# Patient Record
Sex: Female | Born: 1960 | Race: White | Hispanic: No | Marital: Married
Health system: Midwestern US, Community
[De-identification: ages and names within clinical notes are randomized; demographics above are authoritative.]

## PROBLEM LIST (undated history)

## (undated) DIAGNOSIS — Z1231 Encounter for screening mammogram for malignant neoplasm of breast: Secondary | ICD-10-CM

## (undated) DIAGNOSIS — I10 Essential (primary) hypertension: Secondary | ICD-10-CM

## (undated) DIAGNOSIS — F419 Anxiety disorder, unspecified: Secondary | ICD-10-CM

## (undated) DIAGNOSIS — E669 Obesity, unspecified: Principal | ICD-10-CM

## (undated) DIAGNOSIS — R928 Other abnormal and inconclusive findings on diagnostic imaging of breast: Secondary | ICD-10-CM

## (undated) DIAGNOSIS — M62838 Other muscle spasm: Secondary | ICD-10-CM

## (undated) DIAGNOSIS — J069 Acute upper respiratory infection, unspecified: Secondary | ICD-10-CM

## (undated) DIAGNOSIS — Z131 Encounter for screening for diabetes mellitus: Secondary | ICD-10-CM

## (undated) DIAGNOSIS — M79672 Pain in left foot: Secondary | ICD-10-CM

## (undated) DIAGNOSIS — E782 Mixed hyperlipidemia: Secondary | ICD-10-CM

## (undated) DIAGNOSIS — F32A Depression, unspecified: Secondary | ICD-10-CM

## (undated) DIAGNOSIS — Z6841 Body Mass Index (BMI) 40.0 and over, adult: Secondary | ICD-10-CM

## (undated) DIAGNOSIS — M79671 Pain in right foot: Secondary | ICD-10-CM

## (undated) DIAGNOSIS — Z01419 Encounter for gynecological examination (general) (routine) without abnormal findings: Secondary | ICD-10-CM

## (undated) DIAGNOSIS — E66813 Obesity, class 3 (HCC): Secondary | ICD-10-CM

## (undated) DIAGNOSIS — Z7689 Persons encountering health services in other specified circumstances: Secondary | ICD-10-CM

---

## 2008-12-09 ENCOUNTER — Ambulatory Visit: Payer: Self-pay | Admitting: Diagnostic Radiology

## 2008-12-09 ENCOUNTER — Emergency Department (HOSPITAL_BASED_OUTPATIENT_CLINIC_OR_DEPARTMENT_OTHER): Admission: EM | Admit: 2008-12-09 | Discharge: 2008-12-09 | Payer: Self-pay | Admitting: Emergency Medicine

## 2009-05-19 ENCOUNTER — Inpatient Hospital Stay (HOSPITAL_COMMUNITY): Admission: EM | Admit: 2009-05-19 | Discharge: 2009-05-22 | Payer: Self-pay | Admitting: Emergency Medicine

## 2009-10-15 ENCOUNTER — Ambulatory Visit (HOSPITAL_BASED_OUTPATIENT_CLINIC_OR_DEPARTMENT_OTHER): Admission: RE | Admit: 2009-10-15 | Discharge: 2009-10-15 | Payer: Self-pay | Admitting: Obstetrics and Gynecology

## 2009-10-15 ENCOUNTER — Ambulatory Visit: Payer: Self-pay | Admitting: Diagnostic Radiology

## 2010-05-22 IMAGING — CR DG FINGER THUMB 2+V*R*
3 series · 3 of 3 positions shown · non-contrast
Comparison: None

CLINICAL DATA: Right thumb tip indicated.

RIGHT THUMB 2+V

[x finger pa right]
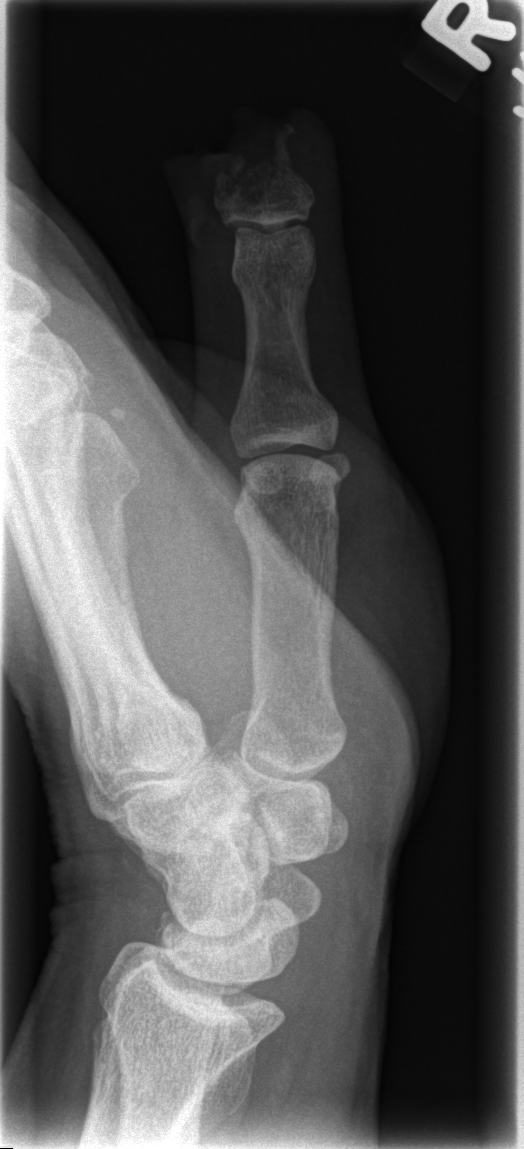

[x finger obl. right]
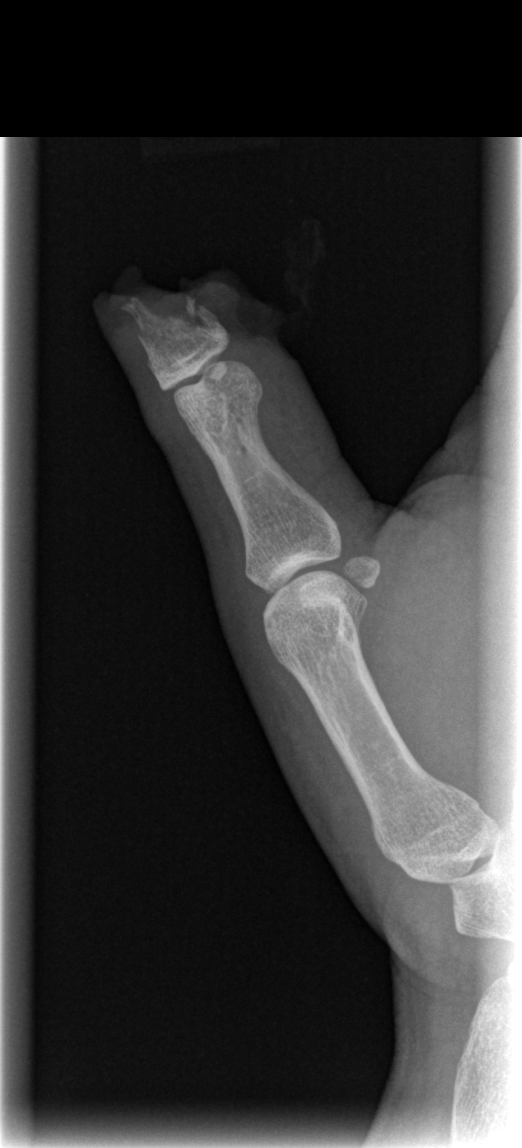

[x finger lateral right]
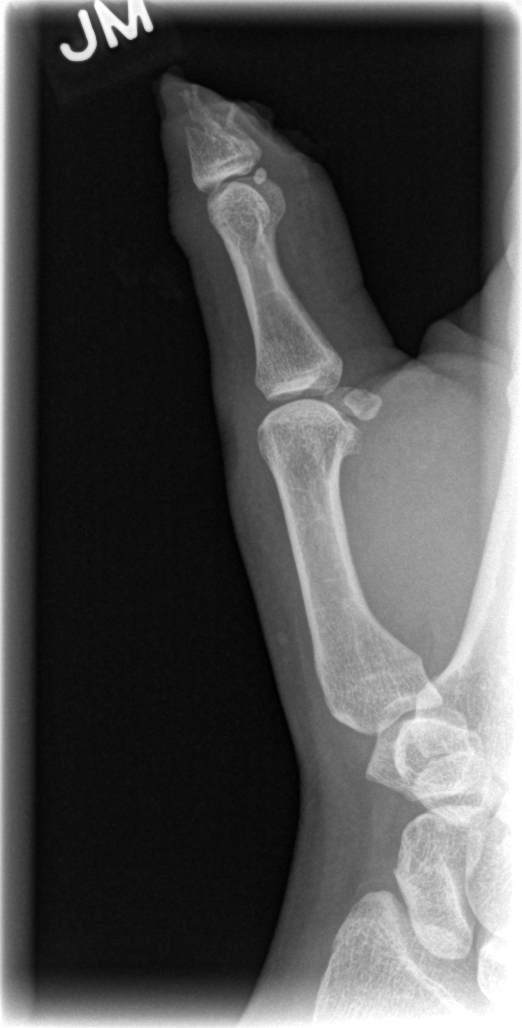

[3 of 3 positions shown; findings below may reference images not displayed]

FINDINGS: Amputation of the right thumb is identified from the mid
distal phalanx distally.
Small fracture fragments are identified along the remaining distal
phalanx.
The interphalangeal joint is unremarkable.
The first metacarpal and proximal phalanx are unremarkable.
IMPRESSION: Amputation of the distal thumb with small fracture fragments along
the remaining distal phalanx.

## 2010-08-01 ENCOUNTER — Other Ambulatory Visit: Payer: Self-pay | Admitting: Obstetrics and Gynecology

## 2010-08-01 DIAGNOSIS — Z780 Asymptomatic menopausal state: Secondary | ICD-10-CM

## 2010-08-01 DIAGNOSIS — Z1231 Encounter for screening mammogram for malignant neoplasm of breast: Secondary | ICD-10-CM

## 2010-10-01 LAB — DIFFERENTIAL
Basophils Absolute: 0 10*3/uL (ref 0.0–0.1)
Basophils Relative: 0 % (ref 0–1)
Eosinophils Absolute: 0.1 10*3/uL (ref 0.0–0.7)
Eosinophils Relative: 1 % (ref 0–5)
Lymphocytes Relative: 18 % (ref 12–46)
Lymphs Abs: 1.5 10*3/uL (ref 0.7–4.0)
Monocytes Absolute: 0.5 10*3/uL (ref 0.1–1.0)
Monocytes Relative: 6 % (ref 3–12)
Neutro Abs: 6.5 10*3/uL (ref 1.7–7.7)
Neutrophils Relative %: 75 % (ref 43–77)

## 2010-10-01 LAB — POCT I-STAT, CHEM 8
Calcium, Ion: 1.19 mmol/L (ref 1.12–1.32)
Chloride: 105 mEq/L (ref 96–112)
Creatinine, Ser: 0.7 mg/dL (ref 0.4–1.2)
Glucose, Bld: 109 mg/dL — ABNORMAL HIGH (ref 70–99)
Hemoglobin: 12.6 g/dL (ref 12.0–15.0)
Potassium: 3.7 mEq/L (ref 3.5–5.1)
Sodium: 139 mEq/L (ref 135–145)

## 2010-10-01 LAB — CBC
HCT: 35.3 % — ABNORMAL LOW (ref 36.0–46.0)
Hemoglobin: 12.3 g/dL (ref 12.0–15.0)
MCHC: 34.7 g/dL (ref 30.0–36.0)
MCV: 87.2 fL (ref 78.0–100.0)
RBC: 4.05 MIL/uL (ref 3.87–5.11)

## 2010-10-01 LAB — PROTIME-INR
INR: 1.02 (ref 0.00–1.49)
Prothrombin Time: 13.3 seconds (ref 11.6–15.2)

## 2010-10-01 LAB — BASIC METABOLIC PANEL
BUN: 7 mg/dL (ref 6–23)
CO2: 27 mEq/L (ref 19–32)
Calcium: 8.5 mg/dL (ref 8.4–10.5)
GFR calc Af Amer: >60 mL/min (ref >60)
Glucose, Bld: 90 mg/dL (ref 70–99)

## 2010-10-01 LAB — GENTAMICIN LEVEL, TROUGH: Gentamicin Trough: 0.6 ug/mL (ref 0.5–2.0)

## 2010-10-01 LAB — VANCOMYCIN, TROUGH: Vancomycin Tr: 19 ug/mL (ref 10.0–20.0)

## 2010-10-17 ENCOUNTER — Ambulatory Visit: Payer: Self-pay

## 2010-10-17 ENCOUNTER — Other Ambulatory Visit: Payer: Self-pay

## 2011-09-11 ENCOUNTER — Other Ambulatory Visit (HOSPITAL_COMMUNITY)
Admission: RE | Admit: 2011-09-11 | Discharge: 2011-09-11 | Disposition: A | Discharge status: Home / Self Care | Payer: 59 | Source: Ambulatory Visit | Attending: Obstetrics and Gynecology | Admitting: Obstetrics and Gynecology

## 2011-09-11 DIAGNOSIS — Z124 Encounter for screening for malignant neoplasm of cervix: Secondary | ICD-10-CM | POA: Insufficient documentation

## 2011-09-11 DIAGNOSIS — Z1159 Encounter for screening for other viral diseases: Secondary | ICD-10-CM | POA: Insufficient documentation

## 2014-01-12 ENCOUNTER — Telehealth: Payer: Self-pay

## 2014-01-12 NOTE — Telephone Encounter (Signed)
Rec'd from [facility] forward 70 pages to Historical Provider

## 2014-01-15 ENCOUNTER — Telehealth: Payer: Self-pay | Admitting: Internal Medicine

## 2014-01-29 NOTE — Telephone Encounter (Signed)
Called patient to let her know that her records were reviewed and accepted.  She said she stayed with [facility], had GI issues and had to be seen right away.  Told patient records would be shredded.

## 2020-04-23 NOTE — Telephone Encounter (Signed)
BENEFITS: [facility]IL L[employer] [facility]    Insurance: ANTHEM  Phone: [phone]  Contact Name: AVAILITY  Effective Date: 10.1.2021     Plan year: YES-CALENDAR  Deductible: 500.00      Deductible Met: 500.00  Allowed/benefits paid at: 100% AFTER DEDUCTIBLE  OOP: 4000.00 MET $1529.00  Freq Limits: 95911 & 95886-BASED ON MEDICAL NECESSITY  Prior Auth Requirement: NO    Notes: NO PRE-EX CLAUSE    Call Reference #: [id number]    Time of call: 8:35AM

## 2020-04-24 ENCOUNTER — Ambulatory Visit
Admit: 2020-04-24 | Discharge: 2020-04-24 | Payer: BLUE CROSS/BLUE SHIELD | Attending: Physical Medicine & Rehabilitation | Primary: Family Medicine

## 2020-04-24 DIAGNOSIS — M79605 Pain in left leg: Secondary | ICD-10-CM

## 2020-08-05 ENCOUNTER — Ambulatory Visit
Admit: 2020-08-05 | Discharge: 2020-08-05 | Payer: BLUE CROSS/BLUE SHIELD | Attending: Physician Assistant | Primary: Family Medicine

## 2020-08-05 DIAGNOSIS — H1013 Acute atopic conjunctivitis, bilateral: Secondary | ICD-10-CM

## 2020-08-05 LAB — CBC WITH AUTO DIFFERENTIAL
Basophils %: 0.6 %
Basophils Absolute: 0 10*3/uL (ref 0.0–0.2)
Eosinophils %: 2.8 %
Eosinophils Absolute: 0.2 10*3/uL (ref 0.0–0.7)
Hematocrit: 39.8 % (ref 37.0–47.0)
Hemoglobin: 13.2 g/dL (ref 12.0–16.0)
Lymphocytes %: 31.7 %
Lymphocytes Absolute: 2 10*3/uL (ref 1.0–4.8)
MCH: 29.1 pg (ref 27.0–31.3)
MCHC: 33.2 % (ref 33.0–37.0)
MCV: 87.7 fL (ref 82.0–100.0)
Monocytes %: 8.9 %
Monocytes Absolute: 0.6 10*3/uL (ref 0.2–0.8)
Neutrophils %: 56 %
Neutrophils Absolute: 3.5 10*3/uL (ref 1.4–6.5)
Platelets: 256 10*3/uL (ref 130–400)
RBC: 4.54 M/uL (ref 4.20–5.40)
RDW: 13.1 % (ref 11.5–14.5)
WBC: 6.2 10*3/uL (ref 4.8–10.8)

## 2020-08-05 LAB — COMPREHENSIVE METABOLIC PANEL
ALT: 14 U/L (ref 0–33)
AST: 14 U/L (ref 0–35)
Albumin: 4.3 g/dL (ref 3.5–4.6)
Alkaline Phosphatase: 112 U/L (ref 40–130)
Anion Gap: 13 mEq/L (ref 9–15)
BUN: 18 mg/dL (ref 8–23)
CO2: 26 mEq/L (ref 20–31)
Calcium: 9.5 mg/dL (ref 8.5–9.9)
Chloride: 105 mEq/L (ref 95–107)
Creatinine: 0.69 mg/dL (ref 0.50–0.90)
GFR African American: >60 (ref >60)
GFR Non-African American: >60 (ref >60)
Globulin: 3.4 g/dL (ref 2.3–3.5)
Glucose: 79 mg/dL (ref 70–99)
Potassium: 4.2 mEq/L (ref 3.4–4.9)
Sodium: 144 mEq/L (ref 135–144)
Total Bilirubin: 0.5 mg/dL (ref 0.2–0.7)
Total Protein: 7.7 g/dL (ref 6.3–8.0)

## 2020-08-05 LAB — LIPID PANEL
Cholesterol, Total: 181 mg/dL (ref 0–199)
HDL: 51 mg/dL (ref 40–59)
LDL Calculated: 91 mg/dL (ref 0–129)
Triglycerides: 197 mg/dL — ABNORMAL HIGH (ref 0–150)

## 2020-08-05 MED ORDER — ESCITALOPRAM OXALATE 5 MG PO TABS
5 MG | ORAL_TABLET | Freq: Every day | ORAL | 4 refills | Status: DC
Start: 2020-08-05 — End: 2021-08-27

## 2020-08-05 MED ORDER — LOSARTAN POTASSIUM 100 MG PO TABS
100 MG | ORAL_TABLET | Freq: Every day | ORAL | 4 refills | Status: DC
Start: 2020-08-05 — End: 2021-08-27

## 2020-08-05 MED ORDER — OLOPATADINE HCL 0.2 % OP SOLN
0.2 | Freq: Every day | OPHTHALMIC | 2 refills | Status: DC
Start: 2020-08-05 — End: 2021-09-30

## 2020-08-05 MED ORDER — ATORVASTATIN CALCIUM 20 MG PO TABS
20 MG | ORAL_TABLET | Freq: Every day | ORAL | 4 refills | Status: DC
Start: 2020-08-05 — End: 2021-08-27

## 2020-08-05 MED ORDER — TIZANIDINE HCL 6 MG PO CAPS
6 MG | ORAL_CAPSULE | Freq: Once | ORAL | 1 refills | Status: AC | PRN
Start: 2020-08-05 — End: 2020-08-05

## 2020-08-05 NOTE — Progress Notes (Signed)
Subjective  Holly Long, 60 y.o. female presents today with:  Chief Complaint   Patient presents with   ??? New Patient     previous PCP Dr. Graciela Husbands   ??? Eye Problem     constant watering in eyes     HPI  Holly Long is in the office today to establish care.  Previous PCP: Dr. Graciela Husbands    Due for screening pap, due for mammogram.    IBD/Crohns.  Controlled with Stelara injections.  Established care with Rachelle Hora.    No recent flare.  Intermittent abdominal spasm/pain.  No melena, hematochezia.     Itchy/watery eyes.  C/o pruritic eyes with irritation/tears.  No purulent drainage.   History of allergies; will take claritin intermittently.   Eye complaint has seemed to worsen since moving to house in [place].   Denies visual disturbance or changes.     HLD.  Controlled with statin.  Denies muscle weakness/myalgias.     Depression and anxiety--controlled with 5 mg lexapro.  Denies worsening mood/depression.  No panic attacks.     HTN--controlled with losartan.  Denies CP, dizziness, SOB, DOE.    Vit D--on replacement therapy.        Review of Systems   Constitutional: Negative for activity change, appetite change, chills, diaphoresis, fatigue and fever.   HENT: Negative for congestion, ear pain, facial swelling, mouth sores, postnasal drip, sinus pressure, tinnitus and trouble swallowing.    Eyes: Positive for discharge and itching. Negative for photophobia and visual disturbance.   Respiratory: Negative for cough, chest tightness, shortness of breath and wheezing.    Cardiovascular: Negative for chest pain, palpitations and leg swelling.   Gastrointestinal: Negative for abdominal distention, abdominal pain, blood in stool, diarrhea, rectal pain and vomiting.   Genitourinary: Negative for dysuria, menstrual problem and pelvic pain.   Skin: Negative for color change and rash.   Neurological: Negative for dizziness, tremors, syncope, speech difficulty, weakness, light-headedness, numbness and headaches.    Psychiatric/Behavioral: Negative for agitation, dysphoric mood and sleep disturbance. The patient is not nervous/anxious.        Past Medical History:   Diagnosis Date   ??? Acute Crohn's disease (HCC)    ??? Hyperlipidemia    ??? Hypertension      Past Surgical History:   Procedure Laterality Date   ??? CARPAL TUNNEL RELEASE     ??? CESAREAN SECTION     ??? TMJ ARTHROPLASTY Left      Social History     Socioeconomic History   ??? Marital status: Unknown     Spouse name: Not on file   ??? Number of children: Not on file   ??? Years of education: Not on file   ??? Highest education level: Not on file   Occupational History   ??? Not on file   Tobacco Use   ??? Smoking status: Former Smoker     Years: 10.00     Types: Cigarettes   ??? Smokeless tobacco: Never Used   Substance and Sexual Activity   ??? Alcohol use: Yes     Alcohol/week: 1.0 - 2.0 standard drink     Types: 1 - 2 Glasses of wine per week   ??? Drug use: Never   ??? Sexual activity: Not on file   Other Topics Concern   ??? Not on file   Social History Narrative   ??? Not on file     Social Determinants of Health     Financial Resource Strain:  Low Risk    ??? Difficulty of Paying Living Expenses: Not hard at all   Food Insecurity: No Food Insecurity   ??? Worried About Programme researcher, broadcasting/film/video in the Last Year: Never true   ??? Ran Out of Food in the Last Year: Never true   Transportation Needs:    ??? Lack of Transportation (Medical): Not on file   ??? Lack of Transportation (Non-Medical): Not on file   Physical Activity:    ??? Days of Exercise per Week: Not on file   ??? Minutes of Exercise per Session: Not on file   Stress:    ??? Feeling of Stress : Not on file   Social Connections:    ??? Frequency of Communication with Friends and Family: Not on file   ??? Frequency of Social Gatherings with Friends and Family: Not on file   ??? Attends Religious Services: Not on file   ??? Active Member of Clubs or Organizations: Not on file   ??? Attends Banker Meetings: Not on file   ??? Marital Status: Not on file    Intimate Partner Violence:    ??? Fear of Current or Ex-Partner: Not on file   ??? Emotionally Abused: Not on file   ??? Physically Abused: Not on file   ??? Sexually Abused: Not on file   Housing Stability:    ??? Unable to Pay for Housing in the Last Year: Not on file   ??? Number of Places Lived in the Last Year: Not on file   ??? Unstable Housing in the Last Year: Not on file     Family History   Problem Relation Age of Onset   ??? High Blood Pressure Mother    ??? High Blood Pressure Father      Allergies   Allergen Reactions   ??? Latex      Current Outpatient Medications   Medication Sig Dispense Refill   ??? COVID-19 mRNA Vacc, Moderna, 100 MCG/0.5ML SUSP injection      ??? escitalopram (LEXAPRO) 5 MG tablet Take 1 tablet by mouth daily 90 tablet 4   ??? tiZANidine (ZANAFLEX) 6 MG capsule Take 1 capsule by mouth once as needed for Muscle spasms Take 6 mg by mouth once as needed for Muscle spasms 90 capsule 1   ??? losartan (COZAAR) 100 MG tablet Take 1 tablet by mouth daily 90 tablet 4   ??? atorvastatin (LIPITOR) 20 MG tablet Take 1 tablet by mouth daily 90 tablet 4   ??? olopatadine (PATADAY) 0.2 % SOLN ophthalmic solution Place 1 drop into both eyes daily 2.5 mL 2   ??? STELARA 90 MG/ML SOSY prefilled syringe      ??? Cholecalciferol (VITAMIN D) 50 MCG (2000 UT) CAPS capsule Take by mouth     ??? loratadine (CLARITIN) 10 MG capsule Take 10 mg by mouth daily     ??? medical marijuana        No current facility-administered medications for this visit.     PMH, Surgical Hx, Family Hx, and Social Hx reviewed and updated.  Health Maintenance reviewed.    Objective  Vitals:    08/05/20 0949   BP: 138/82   Site: Left Lower Arm   Position: Sitting   Cuff Size: Medium Adult   Pulse: 74   Resp: 16   Temp: 97.1 ??F (36.2 ??C)   TempSrc: Temporal   SpO2: 98%   Weight: 271 lb (122.9 kg)   Height: 5\' 7"  (1.702  m)     BP Readings from Last 3 Encounters:   08/05/20 138/82     Wt Readings from Last 3 Encounters:   08/05/20 271 lb (122.9 kg)   04/24/20 254 lb  (115.2 kg)     Physical Exam  Vitals reviewed.   Constitutional:       General: She is not in acute distress.     Appearance: She is not ill-appearing.   HENT:      Head: Normocephalic and atraumatic.      Right Ear: Tympanic membrane, ear canal and external ear normal.      Left Ear: Tympanic membrane, ear canal and external ear normal.      Nose: Nose normal. No congestion.      Mouth/Throat:      Mouth: Mucous membranes are moist.   Eyes:      Conjunctiva/sclera: Conjunctivae normal.   Cardiovascular:      Rate and Rhythm: Normal rate and regular rhythm.   Pulmonary:      Effort: Pulmonary effort is normal.      Breath sounds: Normal breath sounds.   Musculoskeletal:         General: Normal range of motion.   Skin:     General: Skin is warm and dry.   Neurological:      General: No focal deficit present.      Mental Status: She is alert and oriented to person, place, and time.   Psychiatric:         Mood and Affect: Mood normal.         Behavior: Behavior normal.         Thought Content: Thought content normal.         Judgment: Judgment normal.       Assessment & Plan   Hessie was seen today for new patient and eye problem.    Diagnoses and all orders for this visit:    Allergic conjunctivitis of both eyes  -     olopatadine (PATADAY) 0.2 % SOLN ophthalmic solution; Place 1 drop into both eyes daily    Benign essential HTN  -     losartan (COZAAR) 100 MG tablet; Take 1 tablet by mouth daily  -     CBC Auto Differential; Future  -     Comprehensive Metabolic Panel; Future    Need for hepatitis C screening test  -     Hepatitis C Antibody; Future    Screening cholesterol level  -     Lipid Panel    Encounter for screening for HIV  -     HIV Screen; Future    Breast cancer screening by mammogram  -     MAM DIGITAL SCREEN W OR WO CAD BILATERAL; Future    IBD (inflammatory bowel disease)  -     CBC Auto Differential  -     Comprehensive Metabolic Panel    Mixed hyperlipidemia  -     atorvastatin (LIPITOR) 20 MG  tablet; Take 1 tablet by mouth daily    Anxiety and depression  -     escitalopram (LEXAPRO) 5 MG tablet; Take 1 tablet by mouth daily    Lumbar radiculopathy  -     tiZANidine (ZANAFLEX) 6 MG capsule; Take 1 capsule by mouth once as needed for Muscle spasms Take 6 mg by mouth once as needed for Muscle spasms    Other orders  -     Hepatitis C Antibody  -  HIV Screen    4 month follow up with me.  Labs today.  OK for mammogram.  Return in 2-3 weeks for PAP.     Orders Placed This Encounter   Procedures   ??? MAM DIGITAL SCREEN W OR WO CAD BILATERAL     Standing Status:   Future     Standing Expiration Date:   10/03/2021     Order Specific Question:   Reason for exam:     Answer:   screening   ??? Hepatitis C Antibody     Standing Status:   Future     Standing Expiration Date:   08/05/2021   ??? Lipid Panel     Order Specific Question:   Is Patient Fasting?/# of Hours     Answer:   yes, 8 hours   ??? HIV Screen     Standing Status:   Future     Standing Expiration Date:   08/05/2021   ??? CBC Auto Differential     Standing Status:   Future     Standing Expiration Date:   08/05/2021   ??? Comprehensive Metabolic Panel     Standing Status:   Future     Standing Expiration Date:   08/05/2021   ??? Hepatitis C Antibody   ??? HIV Screen   ??? CBC Auto Differential   ??? Comprehensive Metabolic Panel     Orders Placed This Encounter   Medications   ??? escitalopram (LEXAPRO) 5 MG tablet     Sig: Take 1 tablet by mouth daily     Dispense:  90 tablet     Refill:  4   ??? tiZANidine (ZANAFLEX) 6 MG capsule     Sig: Take 1 capsule by mouth once as needed for Muscle spasms Take 6 mg by mouth once as needed for Muscle spasms     Dispense:  90 capsule     Refill:  1   ??? losartan (COZAAR) 100 MG tablet     Sig: Take 1 tablet by mouth daily     Dispense:  90 tablet     Refill:  4   ??? atorvastatin (LIPITOR) 20 MG tablet     Sig: Take 1 tablet by mouth daily     Dispense:  90 tablet     Refill:  4   ??? olopatadine (PATADAY) 0.2 % SOLN ophthalmic solution     Sig:  Place 1 drop into both eyes daily     Dispense:  2.5 mL     Refill:  2     Medications Discontinued During This Encounter   Medication Reason   ??? atorvastatin (LIPITOR) 20 MG tablet DUPLICATE   ??? losartan (COZAAR) 100 MG tablet REORDER   ??? escitalopram (LEXAPRO) 5 MG tablet REORDER   ??? tiZANidine (ZANAFLEX) 6 MG capsule REORDER     No follow-ups on file.      Reviewed with the patient: current clinical status, medications, activities and diet.     Side effects, adverse effects of the medication prescribed today, as well as treatment plan/ rationale and result expectations have been discussed with the patient who expresses understanding and desires to proceed.    Close follow up to evaluate treatment results and for coordination of care.  I have reviewed the patient's medical history in detail and updated the computerized patient record.    Krystle Oberman E Keion Neels, PA-C

## 2020-08-06 LAB — HIV SCREEN: HIV Ag/Ab: NONREACTIVE

## 2020-08-06 LAB — HEPATITIS C ANTIBODY: Hepatitis C Ab: NONREACTIVE

## 2020-08-13 ENCOUNTER — Inpatient Hospital Stay: Admit: 2020-08-13 | Payer: BLUE CROSS/BLUE SHIELD | Primary: Family Medicine

## 2020-08-13 ENCOUNTER — Encounter

## 2020-08-13 DIAGNOSIS — Z1231 Encounter for screening mammogram for malignant neoplasm of breast: Secondary | ICD-10-CM

## 2020-08-15 NOTE — Telephone Encounter (Signed)
From: Holly Long  Sent: 08/15/2020 7:46 AM EST  To: [facility] Clinical Staff  Subject: Holly Long eyedrops    Hi I am still waiting for my eyedrops. [facility] says they could not fill [facility] and reached out to you. I am a frustrated please get these filled for me.  Thank you

## 2020-08-15 NOTE — Telephone Encounter (Signed)
Called pharmacy, the Pataday isn't covered by insurance; however, they gave me alternatives that are covered Zelztin or Epinastin are both covered by insurance.    Please advise and thank you,

## 2020-08-16 MED ORDER — KETOTIFEN FUMARATE 0.025 % OP SOLN
0.025 | Freq: Two times a day (BID) | OPHTHALMIC | 3 refills | Status: AC
Start: 2020-08-16 — End: 2020-08-26

## 2020-08-27 ENCOUNTER — Ambulatory Visit
Admit: 2020-08-27 | Discharge: 2020-08-27 | Payer: BLUE CROSS/BLUE SHIELD | Attending: Physician Assistant | Primary: Family Medicine

## 2020-08-27 ENCOUNTER — Encounter

## 2020-08-27 DIAGNOSIS — Z01419 Encounter for gynecological examination (general) (routine) without abnormal findings: Secondary | ICD-10-CM

## 2020-08-27 MED ORDER — PREMARIN 0.625 MG/GM VA CREA
0.625 MG/GM | VAGINAL | 2 refills | Status: AC
Start: 2020-08-27 — End: 2021-09-30

## 2020-08-27 NOTE — Progress Notes (Signed)
Subjective  Holly Long, 60 y.o. female presents today with:  Chief Complaint   Patient presents with   ??? Gynecologic Exam     HPI  Patient is postmenopausal- No LMP recorded.  Menopausal/perimenopausal symptoms: vaginal dryness.  Hormone therapy side effects: not applicable.    Health Maintenance Due   Topic Date Due   ??? Cervical cancer screen  Never done   ??? Colorectal Cancer Screen  Never done   ??? COVID-19 Vaccine (3 - Booster for Moderna series) 03/26/2020       Hx abnormal PAP: no  Sexual activity: single partner, contraception - none.  Hx of STD: no  Hx of abnormal mammogram: no  Self-breast exams: yes  FH of breast cancer: no  FH of GYN cancer: no   Previous DEXA scan: no  Exercise: no regular exercise    Would also like to discuss referral to endocrinology for adpix for weight loss.   Previously was on medication and did well.  Planning on also making dietary changes.     Review of Systems   Constitutional: Negative for activity change, appetite change, chills, diaphoresis, fatigue and fever.   HENT: Negative for congestion, ear pain, facial swelling, mouth sores, postnasal drip, sinus pressure, tinnitus and trouble swallowing.    Eyes: Negative for photophobia, discharge, itching and visual disturbance.   Respiratory: Negative for cough, chest tightness, shortness of breath and wheezing.    Cardiovascular: Negative for chest pain, palpitations and leg swelling.   Gastrointestinal: Negative for abdominal distention, abdominal pain, blood in stool, diarrhea, rectal pain and vomiting.   Genitourinary: Positive for dyspareunia (due to dryness) and vaginal pain (dryness). Negative for dysuria, menstrual problem, pelvic pain, urgency, vaginal bleeding and vaginal discharge.   Skin: Negative for color change and rash.   Neurological: Negative for dizziness, tremors, syncope, speech difficulty, weakness, light-headedness, numbness and headaches.   Psychiatric/Behavioral: Negative for agitation, dysphoric mood  and sleep disturbance. The patient is not nervous/anxious.      Past Medical History:   Diagnosis Date   ??? Acute Crohn's disease (HCC)    ??? Hyperlipidemia    ??? Hypertension      Past Surgical History:   Procedure Laterality Date   ??? CARPAL TUNNEL RELEASE     ??? CESAREAN SECTION     ??? TMJ ARTHROPLASTY Left      Social History     Socioeconomic History   ??? Marital status: Unknown     Spouse name: Not on file   ??? Number of children: Not on file   ??? Years of education: Not on file   ??? Highest education level: Not on file   Occupational History   ??? Not on file   Tobacco Use   ??? Smoking status: Former Smoker     Years: 10.00     Types: Cigarettes   ??? Smokeless tobacco: Never Used   Substance and Sexual Activity   ??? Alcohol use: Yes     Alcohol/week: 1.0 - 2.0 standard drink     Types: 1 - 2 Glasses of wine per week   ??? Drug use: Never   ??? Sexual activity: Not on file   Other Topics Concern   ??? Not on file   Social History Narrative   ??? Not on file     Social Determinants of Health     Financial Resource Strain: Low Risk    ??? Difficulty of Paying Living Expenses: Not hard at all   Food  Insecurity: No Food Insecurity   ??? Worried About Programme researcher, broadcasting/film/video in the Last Year: Never true   ??? Ran Out of Food in the Last Year: Never true   Transportation Needs:    ??? Lack of Transportation (Medical): Not on file   ??? Lack of Transportation (Non-Medical): Not on file   Physical Activity:    ??? Days of Exercise per Week: Not on file   ??? Minutes of Exercise per Session: Not on file   Stress:    ??? Feeling of Stress : Not on file   Social Connections:    ??? Frequency of Communication with Friends and Family: Not on file   ??? Frequency of Social Gatherings with Friends and Family: Not on file   ??? Attends Religious Services: Not on file   ??? Active Member of Clubs or Organizations: Not on file   ??? Attends Banker Meetings: Not on file   ??? Marital Status: Not on file   Intimate Partner Violence:    ??? Fear of Current or Ex-Partner:  Not on file   ??? Emotionally Abused: Not on file   ??? Physically Abused: Not on file   ??? Sexually Abused: Not on file   Housing Stability:    ??? Unable to Pay for Housing in the Last Year: Not on file   ??? Number of Places Lived in the Last Year: Not on file   ??? Unstable Housing in the Last Year: Not on file     Family History   Problem Relation Age of Onset   ??? High Blood Pressure Mother    ??? High Blood Pressure Father      Allergies   Allergen Reactions   ??? Latex      Current Outpatient Medications   Medication Sig Dispense Refill   ??? tiZANidine (ZANAFLEX) 6 MG capsule TAKE 1 CAPSULE BY MOUTH ONCE AS NEEDED FOR MUSCLE SPASMS     ??? conjugated estrogens (PREMARIN) 0.625 MG/GM vaginal cream Apply daily for 1 week then every other day for 1 week.  Then once weekly. 30 g 2   ??? COVID-19 mRNA Vacc, Moderna, 100 MCG/0.5ML SUSP injection      ??? escitalopram (LEXAPRO) 5 MG tablet Take 1 tablet by mouth daily 90 tablet 4   ??? losartan (COZAAR) 100 MG tablet Take 1 tablet by mouth daily 90 tablet 4   ??? atorvastatin (LIPITOR) 20 MG tablet Take 1 tablet by mouth daily 90 tablet 4   ??? olopatadine (PATADAY) 0.2 % SOLN ophthalmic solution Place 1 drop into both eyes daily 2.5 mL 2   ??? medical marijuana      ??? STELARA 90 MG/ML SOSY prefilled syringe      ??? Cholecalciferol (VITAMIN D) 50 MCG (2000 UT) CAPS capsule Take by mouth     ??? loratadine (CLARITIN) 10 MG capsule Take 10 mg by mouth daily       No current facility-administered medications for this visit.     PMH, Surgical Hx, Family Hx, and Social Hx reviewed and updated.  Health Maintenance reviewed.    Objective  Vitals:    08/27/20 0920   BP: 138/88   Site: Left Upper Arm   Position: Sitting   Cuff Size: Large Adult   Pulse: 79   Resp: 16   Temp: 96.7 ??F (35.9 ??C)   TempSrc: Temporal   SpO2: 99%   Weight: 271 lb (122.9 kg)   Height: 5\' 7"  (1.702 m)  BP Readings from Last 3 Encounters:   08/27/20 138/88   08/05/20 138/82     Wt Readings from Last 3 Encounters:   08/27/20 271  lb (122.9 kg)   08/13/20 260 lb (117.9 kg)   08/05/20 271 lb (122.9 kg)     Physical Exam  Vitals reviewed. Exam conducted with a chaperone present.   Constitutional:       General: She is not in acute distress.     Appearance: She is obese. She is not ill-appearing.   HENT:      Head: Normocephalic and atraumatic.      Right Ear: Tympanic membrane, ear canal and external ear normal.      Left Ear: Tympanic membrane, ear canal and external ear normal.      Nose: Nose normal. No congestion.      Mouth/Throat:      Mouth: Mucous membranes are moist.   Eyes:      Conjunctiva/sclera: Conjunctivae normal.   Cardiovascular:      Rate and Rhythm: Normal rate and regular rhythm.   Pulmonary:      Effort: Pulmonary effort is normal.      Breath sounds: Normal breath sounds.   Genitourinary:     General: Normal vulva.      Labia:         Right: No rash, tenderness, lesion or injury.         Left: No rash, tenderness, lesion or injury.       Comments: Vaginal dryness present.   Musculoskeletal:         General: Normal range of motion.   Skin:     General: Skin is warm and dry.   Neurological:      General: No focal deficit present.      Mental Status: She is alert and oriented to person, place, and time.   Psychiatric:         Mood and Affect: Mood normal.         Behavior: Behavior normal.         Thought Content: Thought content normal.         Judgment: Judgment normal.       Assessment & Plan   Daleah was seen today for gynecologic exam.    Diagnoses and all orders for this visit:    Cervical smear, as part of routine gynecological examination  -     Pap Smear; Future    Vaginal dryness    Menopausal vaginal dryness  -     conjugated estrogens (PREMARIN) 0.625 MG/GM vaginal cream; Apply daily for 1 week then every other day for 1 week.  Then once weekly.    BMI 40.0-44.9, adult [facility])  -     [place] - Kris Hartmann, MD, Endocrinology, [place]    Weight loss counseling, encounter for  -     [place] - Kris Hartmann, MD,  Endocrinology, [facility]    Referral to Dr. Vivia Birmingham.   Trial premarin cream.    6 month follow up with me.    Orders Placed This Encounter   Procedures   ??? Pap Smear     Patient History:    No LMP recorded. Patient is postmenopausal.  OBGYN Status: Postmenopausal  Past Surgical History:  No date: CARPAL TUNNEL RELEASE  No date: CESAREAN SECTION  No date: TMJ ARTHROPLASTY; Left      Social History    Tobacco Use      Smoking status: Former Smoker  Years: 10.00        Types: Cigarettes      Smokeless tobacco: Never Used       Standing Status:   Future     Standing Expiration Date:   08/27/2021     Order Specific Question:   Collection Type     Answer:   Thin Prep     Order Specific Question:   Prior Abnormal Pap Test     Answer:   No     Order Specific Question:   Screening or Diagnostic     Answer:   Screening     Order Specific Question:   HPV Requested?     Answer:   Yes - If Abnormal Reflex HPV     Order Specific Question:   High Risk Patient     Answer:   N/A   ??? [place] - Kris HartmannJeet, Anant, MD, [facility]     Referral Priority:   Routine     Referral Type:   Eval and Treat     Referral Reason:   Specialty Services Required     Referred to Provider:   Kris HartmannAnant Jeet, MD     Requested Specialty:   Endocrinology     Number of Visits Requested:   1     Orders Placed This Encounter   Medications   ??? conjugated estrogens (PREMARIN) 0.625 MG/GM vaginal cream     Sig: Apply daily for 1 week then every other day for 1 week.  Then once weekly.     Dispense:  30 g     Refill:  2     There are no discontinued medications.  No follow-ups on file.      Reviewed with the patient: current clinical status, medications, activities and diet.     Side effects, adverse effects of the medication prescribed today, as well as treatment plan/ rationale and result expectations have been discussed with the patient who expresses understanding and desires to proceed.    Close follow up to evaluate treatment results and for coordination of  care.  I have reviewed the patient's medical history in detail and updated the computerized patient record.    Shamicka Inga E Shanieka Blea, PA-C

## 2020-08-29 NOTE — Telephone Encounter (Signed)
From: Camillia Herter  To: Trilby Leaver Fior  Sent: 08/29/2020 12:38 PM EST  Subject: Medication for dryness    Hi Molly did you send in prescription for the dryness medicine? [facility] yet?

## 2020-09-13 ENCOUNTER — Encounter: Payer: BLUE CROSS/BLUE SHIELD | Attending: "Endocrinology | Primary: Family Medicine

## 2021-02-27 ENCOUNTER — Encounter: Attending: Physician Assistant | Primary: Family Medicine

## 2021-04-11 ENCOUNTER — Ambulatory Visit
Admit: 2021-04-11 | Discharge: 2021-04-11 | Payer: BLUE CROSS/BLUE SHIELD | Attending: Internal Medicine | Primary: Family Medicine

## 2021-04-11 ENCOUNTER — Encounter: Payer: BLUE CROSS/BLUE SHIELD | Attending: Physician Assistant | Primary: Family Medicine

## 2021-04-11 DIAGNOSIS — K529 Noninfective gastroenteritis and colitis, unspecified: Secondary | ICD-10-CM

## 2021-04-11 MED ORDER — TIZANIDINE HCL 4 MG PO TABS
4 MG | ORAL_TABLET | Freq: Every evening | ORAL | 2 refills | Status: DC | PRN
Start: 2021-04-11 — End: 2021-06-18

## 2021-04-11 NOTE — Progress Notes (Signed)
Subjective:      Patient ID: Holly Long is a 60 y.o. female who presents today for:  Chief Complaint   Patient presents with    Establish Care     Wants to discuss her medication tizanidine would like to lower the dose   Requested flu shot   Changed her stelara injection from every 8 weeks to every 4 weeks     Weight Loss       HPI  Patient presenting for a routine follow up visit. Routinely sees [employer], PA-C    HTN- controlled on current treatment- Losartan 100mg  daily. Reports no associated complaints. Adhering to a low salt diet; also trying to make healthy lifestyle changes.     HLD- Controlled on statin therapy. As above, ongoing healthy lifestyle changes.    IBD- Crohn's Disease. Follows with GI at [facility] (Dr. ) at [facility]. Controlled on current treatment. States frequency of dosing was recently decreased from 8 weekly to 4-weekly. Experiences intermittent muscle spasms in this setting which is well controlled on Tizanidine.    Anxiety- Feels her anxiety is well controlled on Lexapro. No associated complaints today.       Past Medical History:   Diagnosis Date    Acute Crohn's disease (HCC)     Hyperlipidemia     Hypertension      Past Surgical History:   Procedure Laterality Date    CARPAL TUNNEL RELEASE      CESAREAN SECTION      TMJ ARTHROPLASTY Left      Family History   Problem Relation Age of Onset    High Blood Pressure Mother     High Blood Pressure Father      Allergies   Allergen Reactions    Latex      Current Outpatient Medications on File Prior to Visit   Medication Sig Dispense Refill    COVID-19 mRNA Vacc, Moderna, 100 MCG/0.5ML SUSP injection       escitalopram (LEXAPRO) 5 MG tablet Take 1 tablet by mouth daily 90 tablet 4    losartan (COZAAR) 100 MG tablet Take 1 tablet by mouth daily 90 tablet 4    atorvastatin (LIPITOR) 20 MG tablet Take 1 tablet by mouth daily 90 tablet 4    olopatadine (PATADAY) 0.2 % SOLN ophthalmic solution Place 1 drop into both eyes daily 2.5 mL 2    STELARA 90  MG/ML SOSY prefilled syringe       Cholecalciferol (VITAMIN D) 50 MCG (2000 UT) CAPS capsule Take by mouth      loratadine (CLARITIN) 10 MG capsule Take 10 mg by mouth daily      conjugated estrogens (PREMARIN) 0.625 MG/GM vaginal cream Apply daily for 1 week then every other day for 1 week.  Then once weekly. (Patient not taking: Reported on 04/11/2021) 30 g 2    medical marijuana        No current facility-administered medications on file prior to visit.         Review of Systems   Constitutional:  Negative for activity change, chills, diaphoresis, fatigue and fever.   Respiratory:  Negative for cough, chest tightness, shortness of breath and wheezing.    Cardiovascular:  Negative for chest pain, palpitations and leg swelling.   Gastrointestinal:  Negative for nausea and vomiting.   Neurological:  Negative for dizziness, syncope, light-headedness and headaches.     Objective:   BP 132/80    Pulse 85    Temp 97.2 ??  F (36.2 ??C) (Temporal)    Wt 271 lb (122.9 kg)    SpO2 97%    BMI 42.44 kg/m??     Physical Exam  Constitutional:       General: She is not in acute distress.     Appearance: Normal appearance. She is normal weight. She is not diaphoretic.   HENT:      Head: Normocephalic and atraumatic.   Cardiovascular:      Rate and Rhythm: Normal rate and regular rhythm.      Pulses: Normal pulses.      Heart sounds: Normal heart sounds. No murmur heard.    No friction rub.   Pulmonary:      Effort: Pulmonary effort is normal. No respiratory distress.      Breath sounds: Normal breath sounds. No wheezing or rhonchi.   Chest:      Chest wall: No tenderness.   Abdominal:      General: Abdomen is flat. Bowel sounds are normal. There is no distension.      Palpations: Abdomen is soft.      Tenderness: There is no abdominal tenderness.   Musculoskeletal:         General: No tenderness. Normal range of motion.      Right lower leg: No edema.      Left lower leg: No edema.   Neurological:      Mental Status: She is alert.        Assessment:      Holly Long was seen today for establish care and weight loss.    Diagnoses and all orders for this visit:    IBD (inflammatory bowel disease)        -     Controlled on Stelara, now on 4-weekly regimen. Follows with Gastroenterology (Dr. Tiburcio Pea, at [facility])        -     Continue treatment per recommendations.    Benign essential HTN        -     Controlled        -     Continue on Losartan 100mg  qd.        -     Low salt diet.    Muscle spasms of both lower extremities  -     tiZANidine (ZANAFLEX) 4 MG tablet; Take 1 tablet by mouth nightly as needed (Muscle spasms)    Encounter for weight loss counseling         -     Counseled on recommended lifestyle modifications- diet, exercise.    Flu vaccine need        -     Influenza, FLUCELVAX, (age 85 mo+), IM, Preservative Free, 0.5 mL      Health Maintenance   Topic Date Due    Colorectal Cancer Screen  Never done    COVID-19 Vaccine (3 - Booster for Moderna series) 03/26/2020    Lipids  08/05/2021    Depression Monitoring  08/05/2021    Breast cancer screen  08/13/2022    Cervical cancer screen  08/28/2023    DTaP/Tdap/Td vaccine (2 - Td or Tdap) 04/06/2029    Flu vaccine  Completed    Shingles vaccine  Completed    Hepatitis C screen  Completed    HIV screen  Completed    Hepatitis A vaccine  Aged Out    Hib vaccine  Aged Out    Meningococcal (ACWY) vaccine  Aged Out    Pneumococcal 0-64 years  Vaccine  Aged Out            Plan:    As above.    Orders Placed This Encounter   Procedures    Influenza, FLUCELVAX, (age 16 mo+), IM, Preservative Free, 0.5 mL    PR INFLUENZA VIRUS VACCINE, NOS     Orders Placed This Encounter   Medications    tiZANidine (ZANAFLEX) 4 MG tablet     Sig: Take 1 tablet by mouth nightly as needed (Muscle spasms)     Dispense:  30 tablet     Refill:  2       Return for F/u in 6 months.        Eben Burow, MD

## 2021-04-11 NOTE — Progress Notes (Signed)
After obtaining consent, and per orders of Dr. Cristy Hilts, injection of influenza given in Right deltoid by Lenis Noon, MA. Patient instructed to remain in clinic for 20 minutes afterwards, and to report any adverse reaction to me immediately.

## 2021-05-06 ENCOUNTER — Encounter

## 2021-06-18 ENCOUNTER — Encounter

## 2021-06-18 MED ORDER — TIZANIDINE HCL 4 MG PO TABS
4 MG | ORAL_TABLET | Freq: Every evening | ORAL | 2 refills | Status: DC | PRN
Start: 2021-06-18 — End: 2021-09-22

## 2021-08-22 ENCOUNTER — Encounter

## 2021-08-27 ENCOUNTER — Ambulatory Visit
Admit: 2021-08-27 | Discharge: 2021-08-27 | Payer: BLUE CROSS/BLUE SHIELD | Attending: Family Medicine | Primary: Family Medicine

## 2021-08-27 ENCOUNTER — Telehealth

## 2021-08-27 ENCOUNTER — Encounter

## 2021-08-27 LAB — HEMOGLOBIN A1C: Hemoglobin A1C: 5.7 % (ref 4.8–5.9)

## 2021-08-27 MED ORDER — ESCITALOPRAM OXALATE 5 MG PO TABS
5 MG | ORAL_TABLET | Freq: Every day | ORAL | 4 refills | Status: AC
Start: 2021-08-27 — End: 2022-03-27

## 2021-08-27 MED ORDER — ATORVASTATIN CALCIUM 20 MG PO TABS
20 MG | ORAL_TABLET | Freq: Every day | ORAL | 4 refills | Status: AC
Start: 2021-08-27 — End: 2022-11-13

## 2021-08-27 MED ORDER — LOSARTAN POTASSIUM 100 MG PO TABS
100 MG | ORAL_TABLET | Freq: Every day | ORAL | 4 refills | Status: AC
Start: 2021-08-27 — End: 2022-11-13

## 2021-08-27 NOTE — Telephone Encounter (Signed)
Patient states that the insurance will cover the [facility],  & [facility], however, patient doesn't want injections.     Please advise and thank you,

## 2021-08-27 NOTE — Patient Instructions (Signed)
Have your lab work done  Refilled your medications  Continue with your gastroenterologist and routine colonoscopies  Find out which medication your insurance will cover for weight loss and let my office know  Follow-up in 6 months

## 2021-08-27 NOTE — Telephone Encounter (Signed)
Patient called back stating that she will need office notes and a pre-authorization completed for mediation.

## 2021-08-27 NOTE — Progress Notes (Signed)
08/27/2021        Holly Long 1960/07/10 is a 61 y.o. female who presents today with:  Chief Complaint   Patient presents with    Establish Care    Weight Loss       HPI:   Holly Long presents today to establish care     HTN- controlled on current treatment- Losartan 100mg  daily. Reports no associated complaints. Adhering to a low salt diet; also trying to make healthy lifestyle changes.      HLD- Controlled on statin therapy. As above, ongoing healthy lifestyle changes.     IBD- Crohn's Disease. Follows with GI on [facility] (Dr. ) at [facility]. Controlled on current treatment.     Muscle spasms b/l Les: Experiences intermittent muscle spasms in this setting which is well controlled on Tizanidine as needed.      Anxiety- Feels her anxiety is well controlled on Lexapro. No associated complaints today.  Denies any paranoia, visual or auditory hallucinations, suicidal or homicidal ideations.    Weight gain -she has felt fatigue recently and has gained 10 pounds since last visit.  Would like to try med as she has tried dieting and exercise. Checking insurance.     Health Maintenance  Colonoscopy: GI follows and does every 2 years  Pap smear: due 08/28/23  Mammogram: due 08/13/22  DEXA: due 10/18/25  PNA VAX: Had 2 years ago  Shingles VAX: done  Tetanus VAX: 04/06/29      Assessment & Plan   1. Encounter to establish care with new doctor  -     Hemoglobin A1C; Future  2. Mixed hyperlipidemia  -     atorvastatin (LIPITOR) 20 MG tablet; Take 1 tablet by mouth daily, Disp-90 tablet, R-4Normal  3. Benign essential HTN  Comments:  -     Controlled  -     Continue on Losartan 100mg  qd.  -     Low salt diet.  Orders:  -     losartan (COZAAR) 100 MG tablet; Take 1 tablet by mouth daily, Disp-90 tablet, R-4Normal  4. Anxiety and depression  -     escitalopram (LEXAPRO) 5 MG tablet; Take 1 tablet by mouth daily, Disp-90 tablet, R-4Normal  5. IBD (inflammatory bowel disease)  Comments:  -     Controlled on St[facility], now on 4-weekly regimen.  Follows with Gastroenterology (Dr. 06/06/29, at [facility])  -     Continue treatment per recommendations.  6. Morbid obesity (HCC)  7. Lumbar radiculopathy  8. Muscle spasms of both lower extremities  Comments:  -     tiZANidine (ZANAFLEX) 4 MG tablet; Take 1 tablet by mouth nightly as needed (Muscle spasms)  9. Other fatigue  -     Vitamin B12; Future  -     TSH with Reflex; Future     Medications Discontinued During This Encounter   Medication Reason    escitalopram (LEXAPRO) 5 MG tablet REORDER    losartan (COZAAR) 100 MG tablet REORDER    atorvastatin (LIPITOR) 20 MG tablet REORDER     No follow-ups on file.        Objective  Allergies   Allergen Reactions    Latex      Current Outpatient Medications   Medication Sig Dispense Refill    atorvastatin (LIPITOR) 20 MG tablet Take 1 tablet by mouth daily 90 tablet 4    losartan (COZAAR) 100 MG tablet Take 1 tablet by mouth daily 90 tablet 4    escitalopram (LEXAPRO)  5 MG tablet Take 1 tablet by mouth daily 90 tablet 4    tiZANidine (ZANAFLEX) 4 MG tablet TAKE 1 TABLET BY MOUTH NIGHTLY AS NEEDED (MUSCLE SPASMS) 30 tablet 2    COVID-19 mRNA Vacc, Moderna, 100 MCG/0.5ML SUSP injection       olopatadine (PATADAY) 0.2 % SOLN ophthalmic solution Place 1 drop into both eyes daily 2.5 mL 2    medical marijuana       STELARA 90 MG/ML SOSY prefilled syringe       Cholecalciferol (VITAMIN D) 50 MCG (2000 UT) CAPS capsule Take by mouth      loratadine (CLARITIN) 10 MG capsule Take 10 mg by mouth daily      conjugated estrogens (PREMARIN) 0.625 MG/GM vaginal cream Apply daily for 1 week then every other day for 1 week.  Then once weekly. (Patient not taking: No sig reported) 30 g 2     No current facility-administered medications for this visit.       PMH, Surgical Hx, Family Hx, and Social Hx reviewed and updated.  Health Maintenance reviewed.    Vitals:    08/27/21 1056   BP: 134/84   Site: Right Upper Arm   Position: Sitting   Cuff Size: Large Adult   Pulse: 78   Temp: 97.8 ??F (36.6  ??C)   TempSrc: Temporal   SpO2: 98%   Weight: 281 lb (127.5 kg)   Height: 5\' 7"  (1.702 m)     BP Readings from Last 3 Encounters:   08/27/21 134/84   04/11/21 132/80   08/27/20 138/88     Wt Readings from Last 3 Encounters:   08/27/21 281 lb (127.5 kg)   04/11/21 271 lb (122.9 kg)   08/27/20 271 lb (122.9 kg)       No results found for: LABA1C  Lab Results   Component Value Date    CREATININE 0.69 08/05/2020     Lab Results   Component Value Date    ALT 14 08/05/2020    AST 14 08/05/2020     Lab Results   Component Value Date    CHOL 181 08/05/2020    TRIG 197 (H) 08/05/2020    HDL 51 08/05/2020    LDLCALC 91 08/05/2020        Review of Systems 14 point Review-of-systems negative unless otherwise stated in HPI.   Physical Exam  Constitutional:       General: She is not in acute distress.     Appearance: Normal appearance. She is not toxic-appearing.   HENT:      Right Ear: External ear normal. There is impacted cerumen.      Left Ear: External ear normal. There is impacted cerumen.      Mouth/Throat:      Mouth: Mucous membranes are moist.   Eyes:      Extraocular Movements: Extraocular movements intact.      Pupils: Pupils are equal, round, and reactive to light.   Neck:      Vascular: No carotid bruit.   Cardiovascular:      Rate and Rhythm: Normal rate and regular rhythm.      Pulses: Normal pulses.      Heart sounds: Normal heart sounds.   Pulmonary:      Effort: No respiratory distress.      Breath sounds: No wheezing.   Abdominal:      General: Bowel sounds are normal.      Palpations: Abdomen is soft.  Tenderness: There is no abdominal tenderness.   Musculoskeletal:         General: Normal range of motion.   Skin:     General: Skin is warm and dry.   Neurological:      Mental Status: She is alert and oriented to person, place, and time. Mental status is at baseline.   Psychiatric:         Mood and Affect: Mood normal.         Behavior: Behavior normal.             Reviewed with the patient: current  clinical status, medications, activities and diet.     Side effects, adverse effects of the medication prescribed today, as well as treatment plan/ rationale and result expectations have been discussed with the patient who expresses understanding and desires to proceed.    Close follow up to evaluate treatment results and for coordination of care.  I have reviewed the patient's medical history in detail and updated the computerized patient record.    Thermon Leyland, MD

## 2021-08-28 LAB — VITAMIN B12: Vitamin B-12: 432 pg/mL (ref 232–1245)

## 2021-08-28 LAB — TSH WITH REFLEX: TSH: 1.16 u[IU]/mL (ref 0.440–3.860)

## 2021-08-28 NOTE — Telephone Encounter (Signed)
Patient called office and I message PCP. Duplicate request

## 2021-08-28 NOTE — Telephone Encounter (Signed)
Patient agrees to the adipex (phentermine) medication instead of the injections.    Please advise and thank you,

## 2021-08-29 ENCOUNTER — Encounter

## 2021-08-29 MED ORDER — PHENTERMINE HCL 37.5 MG PO CAPS
37.5 MG | ORAL_CAPSULE | Freq: Every morning | ORAL | 0 refills | Status: AC
Start: 2021-08-29 — End: 2021-09-28

## 2021-08-29 MED ORDER — PHENTERMINE HCL 37.5 MG PO TABS
37.5 MG | ORAL_TABLET | Freq: Every day | ORAL | 0 refills | Status: DC
Start: 2021-08-29 — End: 2021-08-29

## 2021-08-29 NOTE — Addendum Note (Signed)
Addended by: Ria Comment on: 08/29/2021 11:41 AM     Modules accepted: Orders

## 2021-08-30 NOTE — Telephone Encounter (Signed)
Scheduled 09/30/21

## 2021-09-19 ENCOUNTER — Encounter

## 2021-09-19 NOTE — Telephone Encounter (Signed)
Pharmacy requesting medication refill. Please approve or deny this request.    Rx requested:  Requested Prescriptions     Pending Prescriptions Disp Refills    tiZANidine (ZANAFLEX) 4 MG tablet 30 tablet 2     Sig: Take 1 tablet by mouth nightly as needed (Muscle spasms)         Last Office Visit:   08/27/2021      Next Visit Date:  Future Appointments   Date Time Provider Department Center   09/30/2021 10:15 AM Thermon Leyland, MD [facility]  [place]   02/27/2022 10:00 AM Thermon Leyland, MD [facility]

## 2021-09-22 MED ORDER — TIZANIDINE HCL 4 MG PO TABS
4 MG | ORAL_TABLET | Freq: Every evening | ORAL | 2 refills | Status: AC | PRN
Start: 2021-09-22 — End: ?

## 2021-09-30 ENCOUNTER — Ambulatory Visit
Admit: 2021-09-30 | Discharge: 2021-09-30 | Payer: BLUE CROSS/BLUE SHIELD | Attending: Family Medicine | Primary: Family Medicine

## 2021-09-30 MED ORDER — PHENTERMINE HCL 37.5 MG PO TABS
37.5 MG | ORAL_TABLET | Freq: Every day | ORAL | 0 refills | Status: DC
Start: 2021-09-30 — End: 2021-10-28

## 2021-09-30 NOTE — Patient Instructions (Signed)
Refilled phentermine 37.5 mg daily for 30 days.  Follow-up for refills.

## 2021-09-30 NOTE — Progress Notes (Signed)
09/30/2021        Holly Long August 01, 1960 is a 61 y.o. female who presents today with:  Chief Complaint   Patient presents with    Medication Refill     Adipex   Goal weight 200lbs       HPI:   Holly Long presents today for weight loss follow-up.  She has been taking Adipex since March 2023.  Her initial weight was 281 pounds.  She is at 265 pounds today.  She denies any side effects such as nausea, vomiting, constipation, diarrhea, blurry vision, dry mouth, insomnia, palpitations.  She is doing well and would like to continue.    Assessment & Plan   1. Morbid obesity (HCC)  -     phentermine (ADIPEX-P) 37.5 MG tablet; Take 1 tablet by mouth every morning (before breakfast) for 30 days. Max Daily Amount: 37.5 mg, Disp-30 tablet, R-0Normal     Medications Discontinued During This Encounter   Medication Reason    conjugated estrogens (PREMARIN) 0.625 MG/GM vaginal cream LIST CLEANUP    medical marijuana LIST CLEANUP    olopatadine (PATADAY) 0.2 % SOLN ophthalmic solution LIST CLEANUP     Return in about 4 weeks (around 10/28/2021).        Objective  Allergies   Allergen Reactions    Latex      Current Outpatient Medications   Medication Sig Dispense Refill    phentermine (ADIPEX-P) 37.5 MG tablet Take 1 tablet by mouth every morning (before breakfast) for 30 days. Max Daily Amount: 37.5 mg 30 tablet 0    tiZANidine (ZANAFLEX) 4 MG tablet Take 1 tablet by mouth nightly as needed (Muscle spasms) 30 tablet 2    atorvastatin (LIPITOR) 20 MG tablet Take 1 tablet by mouth daily 90 tablet 4    losartan (COZAAR) 100 MG tablet Take 1 tablet by mouth daily 90 tablet 4    escitalopram (LEXAPRO) 5 MG tablet Take 1 tablet by mouth daily 90 tablet 4    STELARA 90 MG/ML SOSY prefilled syringe       Cholecalciferol (VITAMIN D) 50 MCG (2000 UT) CAPS capsule Take by mouth      loratadine (CLARITIN) 10 MG capsule Take 1 capsule by mouth daily      COVID-19 mRNA Vacc, Moderna, 100 MCG/0.5ML SUSP injection        No current  facility-administered medications for this visit.       PMH, Surgical Hx, Family Hx, and Social Hx reviewed and updated.  Health Maintenance reviewed.    Vitals:    09/30/21 0951   BP: 130/72   Pulse: 90   Temp: 96.8 F (36 C)   TempSrc: Temporal   SpO2: 98%   Weight: 265 lb (120.2 kg)   Height: 5\' 7"  (1.702 m)     BP Readings from Last 3 Encounters:   09/30/21 130/72   08/27/21 134/84   04/11/21 132/80     Wt Readings from Last 3 Encounters:   09/30/21 265 lb (120.2 kg)   08/27/21 281 lb (127.5 kg)   04/11/21 271 lb (122.9 kg)       Lab Results   Component Value Date    LABA1C 5.7 08/27/2021     Lab Results   Component Value Date    CREATININE 0.69 08/05/2020     Lab Results   Component Value Date    ALT 14 08/05/2020    AST 14 08/05/2020     Lab Results   Component Value  Date    CHOL 181 08/05/2020    TRIG 197 (H) 08/05/2020    HDL 51 08/05/2020    LDLCALC 91 08/05/2020        Review of Systems 14 point Review-of-systems negative unless otherwise stated in HPI.   Physical Exam  Constitutional:       General: She is not in acute distress.     Appearance: Normal appearance. She is obese. She is not toxic-appearing.   HENT:      Right Ear: External ear normal. There is impacted cerumen.      Left Ear: External ear normal. There is impacted cerumen.      Mouth/Throat:      Mouth: Mucous membranes are moist.   Eyes:      Extraocular Movements: Extraocular movements intact.      Pupils: Pupils are equal, round, and reactive to light.   Neck:      Vascular: No carotid bruit.   Cardiovascular:      Rate and Rhythm: Normal rate and regular rhythm.      Pulses: Normal pulses.      Heart sounds: Normal heart sounds.   Pulmonary:      Effort: No respiratory distress.      Breath sounds: No wheezing.   Abdominal:      General: Bowel sounds are normal.      Palpations: Abdomen is soft.      Tenderness: There is no abdominal tenderness.   Musculoskeletal:         General: Normal range of motion.   Skin:     General: Skin is  warm and dry.   Neurological:      Mental Status: She is alert and oriented to person, place, and time. Mental status is at baseline.   Psychiatric:         Mood and Affect: Mood normal.         Behavior: Behavior normal.             Reviewed with the patient: current clinical status, medications, activities and diet.     Side effects, adverse effects of the medication prescribed today, as well as treatment plan/ rationale and result expectations have been discussed with the patient who expresses understanding and desires to proceed.    Close follow up to evaluate treatment results and for coordination of care.  I have reviewed the patient's medical history in detail and updated the computerized patient record.    Holly Leyland, MD

## 2021-10-07 NOTE — Telephone Encounter (Signed)
Colonoscopy done at [facility] on 10/2020

## 2021-10-10 ENCOUNTER — Encounter: Payer: BLUE CROSS/BLUE SHIELD | Attending: Internal Medicine | Primary: Family Medicine

## 2021-10-28 ENCOUNTER — Ambulatory Visit
Admit: 2021-10-28 | Discharge: 2021-10-28 | Payer: BLUE CROSS/BLUE SHIELD | Attending: Family Medicine | Primary: Family Medicine

## 2021-10-28 MED ORDER — PHENTERMINE HCL 37.5 MG PO TABS
37.5 MG | ORAL_TABLET | Freq: Every day | ORAL | 0 refills | Status: AC
Start: 2021-10-28 — End: 2021-11-27

## 2021-10-28 NOTE — Patient Instructions (Signed)
Phentermine 37.5 mg daily every morning.  Follow-up in 1 month

## 2021-10-28 NOTE — Progress Notes (Signed)
10/28/2021  Holly Long [birth year] is a 61 y.o. female who presents today with:  Chief Complaint   Patient presents with    Medication Refill     Adipex   Goal weight 200lbs       HPI:   Holly Long presents today for weight loss follow-up.  She has been taking Adipex since March 2023.  Her initial weight was 281 pounds.  She is at 257 pounds today.  She denies any side effects such as nausea, vomiting, constipation, diarrhea, blurry vision, dry mouth, insomnia, palpitations.  She is doing well and would like to continue.  She is aware that is a 30-month course however she will continue it for longer.    Assessment & Plan   1. Morbid obesity (HCC)  -     phentermine (ADIPEX-P) 37.5 MG tablet; Take 1 tablet by mouth every morning (before breakfast) for 30 days. Max Daily Amount: 37.5 mg, Disp-30 tablet, R-0Normal     Medications Discontinued During This Encounter   Medication Reason    conjugated estrogens (PREMARIN) 0.625 MG/GM vaginal cream LIST CLEANUP    medical marijuana LIST CLEANUP    olopatadine (PATADAY) 0.2 % SOLN ophthalmic solution LIST CLEANUP     Return in about 4 weeks (around 10/28/2021).        Objective  Allergies   Allergen Reactions    Latex      Current Outpatient Medications   Medication Sig Dispense Refill    phentermine (ADIPEX-P) 37.5 MG tablet Take 1 tablet by mouth every morning (before breakfast) for 30 days. Max Daily Amount: 37.5 mg 30 tablet 0    tiZANidine (ZANAFLEX) 4 MG tablet Take 1 tablet by mouth nightly as needed (Muscle spasms) 30 tablet 2    atorvastatin (LIPITOR) 20 MG tablet Take 1 tablet by mouth daily 90 tablet 4    losartan (COZAAR) 100 MG tablet Take 1 tablet by mouth daily 90 tablet 4    escitalopram (LEXAPRO) 5 MG tablet Take 1 tablet by mouth daily 90 tablet 4    STELARA 90 MG/ML SOSY prefilled syringe       Cholecalciferol (VITAMIN D) 50 MCG (2000 UT) CAPS capsule Take by mouth      loratadine (CLARITIN) 10 MG capsule Take 1 capsule by mouth daily      COVID-19 mRNA Vacc,  Moderna, 100 MCG/0.5ML SUSP injection        No current facility-administered medications for this visit.       PMH, Surgical Hx, Family Hx, and Social Hx reviewed and updated.  Health Maintenance reviewed.    Vitals:    09/30/21 0951   BP: 130/72   Pulse: 90   Temp: 96.8 F (36 C)   TempSrc: Temporal   SpO2: 98%   Weight: 265 lb (120.2 kg)   Height: 5\' 7"  (1.702 m)     BP Readings from Last 3 Encounters:   09/30/21 130/72   08/27/21 134/84   04/11/21 132/80     Wt Readings from Last 3 Encounters:   09/30/21 265 lb (120.2 kg)   08/27/21 281 lb (127.5 kg)   04/11/21 271 lb (122.9 kg)       Lab Results   Component Value Date    LABA1C 5.7 08/27/2021     Lab Results   Component Value Date    CREATININE 0.69 08/05/2020     Lab Results   Component Value Date    ALT 14 08/05/2020    AST 14 08/05/2020  Lab Results   Component Value Date    CHOL 181 08/05/2020    TRIG 197 (H) 08/05/2020    HDL 51 08/05/2020    LDLCALC 91 08/05/2020        Review of Systems 14 point Review-of-systems negative unless otherwise stated in HPI.   Physical Exam  Constitutional:       General: She is not in acute distress.     Appearance: Normal appearance. She is obese. She is not toxic-appearing.   HENT:      Right Ear: External ear normal.      Left Ear: External ear normal.     Mouth/Throat:      Mouth: Mucous membranes are moist.   Eyes:      Extraocular Movements: Extraocular movements intact.      Pupils: Pupils are equal, round, and reactive to light.   Neck:      Vascular: No carotid bruit.   Cardiovascular:      Rate and Rhythm: Normal rate and regular rhythm.      Pulses: Normal pulses.      Heart sounds: Normal heart sounds.   Pulmonary:      Effort: No respiratory distress.      Breath sounds: No wheezing.   Abdominal:      General: Bowel sounds are normal.      Palpations: Abdomen is soft.      Tenderness: There is no abdominal tenderness.   Musculoskeletal:         General: Normal range of motion.   Skin:     General: Skin is  warm and dry.   Neurological:      Mental Status: She is alert and oriented to person, place, and time. Mental status is at baseline.   Psychiatric:         Mood and Affect: Mood normal.         Behavior: Behavior normal.             Reviewed with the patient: current clinical status, medications, activities and diet.     Side effects, adverse effects of the medication prescribed today, as well as treatment plan/ rationale and result expectations have been discussed with the patient who expresses understanding and desires to proceed.    Close follow up to evaluate treatment results and for coordination of care.  I have reviewed the patient's medical history in detail and updated the computerized patient record.    Thermon Leyland, MD

## 2021-11-19 NOTE — Telephone Encounter (Signed)
-----   Message from Danella Deis sent at 11/19/2021  2:39 PM EDT -----  Subject: Message to Provider    QUESTIONS  Information for Provider? R/s 5/29 appt to 6/29 at pt's request. She says   she will be out of town and plans to take a break from Adipex for 1 month   during that time.  ---------------------------------------------------------------------------  --------------  Holly Long INFO  [phone]; OK to leave message on voicemail,OK to respond with electronic   message via MyChart portal (only for patients who have registered MyChart   account)  ---------------------------------------------------------------------------  --------------  SCRIPT ANSWERS  Relationship to Patient? Self

## 2021-11-19 NOTE — Telephone Encounter (Signed)
Please advise and thank you,

## 2021-11-22 NOTE — Telephone Encounter (Signed)
My chart message sent also

## 2021-11-22 NOTE — Telephone Encounter (Signed)
Patient was called to let her know that stopping medication for a month is fine.

## 2021-11-26 ENCOUNTER — Encounter: Payer: BLUE CROSS/BLUE SHIELD | Attending: Family Medicine | Primary: Family Medicine

## 2021-12-11 ENCOUNTER — Ambulatory Visit: Admit: 2021-12-11 | Payer: BLUE CROSS/BLUE SHIELD | Primary: Family Medicine

## 2021-12-11 ENCOUNTER — Ambulatory Visit: Admit: 2021-12-11 | Discharge: 2021-12-11 | Payer: BLUE CROSS/BLUE SHIELD | Attending: Family | Primary: Family Medicine

## 2021-12-11 DIAGNOSIS — M25532 Pain in left wrist: Secondary | ICD-10-CM

## 2021-12-11 MED ORDER — PREDNISONE 50 MG PO TABS
50 MG | ORAL_TABLET | Freq: Every day | ORAL | 0 refills | Status: AC
Start: 2021-12-11 — End: 2021-12-16

## 2021-12-11 NOTE — Progress Notes (Signed)
Subjective:      Patient ID: Holly Long is a 61 y.o. female who presents today for:  Chief Complaint   Patient presents with    Wrist Pain     Patient presents today with left wrist pain, painful with movement        HPI        Started hurting yesterday around 4 after walking her dog   She noticed its swollen   The pinky side from the elbow down in to the hand   Its throbbing   Pulse strong and good temp arm   Very tender to touch   Cannot take NSAIDs   Was taking tylenol with no relief   Denies any numbness or tingling     Past Medical History:   Diagnosis Date    Acute Crohn's disease (HCC)     Hyperlipidemia     Hypertension      Past Surgical History:   Procedure Laterality Date    CARPAL TUNNEL RELEASE      CESAREAN SECTION      TMJ ARTHROPLASTY Left      Social History     Socioeconomic History    Marital status: Unknown     Spouse name: Not on file    Number of children: Not on file    Years of education: Not on file    Highest education level: Not on file   Occupational History    Not on file   Tobacco Use    Smoking status: Former     Packs/day: 0.50     Years: 10.00     Pack years: 5.00     Types: Cigarettes     Quit date: 06/29/1981     Years since quitting: 40.4     Passive exposure: Never    Smokeless tobacco: Never   Vaping Use    Vaping Use: Never used   Substance and Sexual Activity    Alcohol use: Yes     Alcohol/week: 1.0 - 2.0 standard drink     Types: 1 - 2 Glasses of wine per week    Drug use: Never    Sexual activity: Not on file   Other Topics Concern    Not on file   Social History Narrative    Not on file     Social Determinants of Health     Financial Resource Strain: Low Risk     Difficulty of Paying Living Expenses: Not very hard   Food Insecurity: No Food Insecurity    Worried About Running Out of Food in the Last Year: Never true    Ran Out of Food in the Last Year: Never true   Transportation Needs: Unknown    Lack of Transportation (Medical): Not on file    Lack of Transportation  (Non-Medical): No   Physical Activity: Not on file   Stress: Not on file   Social Connections: Not on file   Intimate Partner Violence: Not on file   Housing Stability: Unknown    Unable to Pay for Housing in the Last Year: Not on file    Number of Places Lived in the Last Year: Not on file    Unstable Housing in the Last Year: No     Family History   Problem Relation Age of Onset    High Blood Pressure Mother     High Blood Pressure Father      Allergies   Allergen Reactions  Latex      Current Outpatient Medications   Medication Sig Dispense Refill    predniSONE (DELTASONE) 50 MG tablet Take 1 tablet by mouth daily for 5 days 5 tablet 0    tiZANidine (ZANAFLEX) 4 MG tablet Take 1 tablet by mouth nightly as needed (Muscle spasms) 30 tablet 2    atorvastatin (LIPITOR) 20 MG tablet Take 1 tablet by mouth daily 90 tablet 4    losartan (COZAAR) 100 MG tablet Take 1 tablet by mouth daily 90 tablet 4    escitalopram (LEXAPRO) 5 MG tablet Take 1 tablet by mouth daily 90 tablet 4    COVID-19 mRNA Vacc, Moderna, 100 MCG/0.5ML SUSP injection       STELARA 90 MG/ML SOSY prefilled syringe       Cholecalciferol (VITAMIN D) 50 MCG (2000 UT) CAPS capsule Take by mouth      loratadine (CLARITIN) 10 MG capsule Take 1 capsule by mouth daily       No current facility-administered medications for this visit.          Review of Systems   Constitutional:  Negative for chills, fatigue and fever.   HENT:  Negative for congestion, rhinorrhea and sore throat.    Respiratory:  Negative for cough, shortness of breath and wheezing.    Gastrointestinal:  Negative for diarrhea, nausea and vomiting.   Musculoskeletal:  Positive for arthralgias. Negative for myalgias.   Skin:  Negative for rash.   Neurological:  Negative for headaches.   Psychiatric/Behavioral:  Negative for agitation, confusion and hallucinations.      Objective:   BP 128/80 (Site: Right Upper Arm, Position: Sitting, Cuff Size: Large Adult)   Pulse 84   Temp 97.4 F (36.3 C)  (Temporal)   Ht 5\' 7"  (1.702 m)   Wt 255 lb (115.7 kg)   SpO2 98%   BMI 39.94 kg/m     Physical Exam  Vitals reviewed.   Constitutional:       Appearance: Normal appearance.   HENT:      Head: Normocephalic and atraumatic.      Nose: Nose normal.      Mouth/Throat:      Lips: Pink.   Eyes:      General: Lids are normal.      Conjunctiva/sclera: Conjunctivae normal.   Cardiovascular:      Rate and Rhythm: Normal rate.   Pulmonary:      Effort: Pulmonary effort is normal.   Musculoskeletal:      Left wrist: Swelling and tenderness present. Decreased range of motion.      Left hand: Tenderness present. Decreased range of motion. Normal pulse.        Arms:       Cervical back: Normal range of motion.      Comments: Very tender to touch on the ulnar side hand and wrist, limited ROM due to pain    Skin:     General: Skin is warm and dry.   Neurological:      General: No focal deficit present.      Mental Status: She is alert and oriented to person, place, and time.   Psychiatric:         Mood and Affect: Mood normal.         Behavior: Behavior normal. Behavior is cooperative.       Assessment:       Diagnosis Orders   1. Wrist pain, acute, left  predniSONE (DELTASONE) 50 MG  tablet    XR HAND LEFT (MIN 3 VIEWS)    XR WRIST LEFT (MIN 3 VIEWS)    [employer] Wrist Brace, Left        No results found for this visit on 12/11/21.   Plan:     Assessment & Plan   Holly Long was seen today for wrist pain.    Diagnoses and all orders for this visit:    Wrist pain, acute, left  -     predniSONE (DELTASONE) 50 MG tablet; Take 1 tablet by mouth daily for 5 days  -     XR HAND LEFT (MIN 3 VIEWS); Future  -     XR WRIST LEFT (MIN 3 VIEWS); Future  -     [employer] Wrist Brace, Left      Orders Placed This Encounter   Procedures    XR HAND LEFT (MIN 3 VIEWS)     Standing Status:   Future     Number of Occurrences:   1     Standing Expiration Date:   12/11/2021     Order Specific Question:    Reason for exam:     Answer:   injury, pain ulnar side    XR WRIST LEFT (MIN 3 VIEWS)     Standing Status:   Future     Number of Occurrences:   1     Standing Expiration Date:   12/12/2022     Order Specific Question:   Reason for exam:     Answer:   injury, pain ulnar side    [employer] Wrist Brace, Left     Order Specific Question:   Equipment:     Answer:   Wrist Brace, Left     Orders Placed This Encounter   Medications    predniSONE (DELTASONE) 50 MG tablet     Sig: Take 1 tablet by mouth daily for 5 days     Dispense:  5 tablet     Refill:  0     There are no discontinued medications.  Return if symptoms worsen or fail to improve.        Reviewed with the patient/family: current clinical status & medications.  Side effects of the medication prescribed today, as well as treatment plan/rationale and result expectations have been discussed with the patient/family who expresses understanding. Patient will be discharged home in stable condition.    Follow up with PCP to evaluate treatment results or return if symptoms worsen or fail to improve. Discussed signs and symptoms which require immediate follow-up in ED/call to 911.  Understanding verbalized.     I have reviewed the patient's medical history in detail and updated the computerized patient record.    Sydell Axon, APRN - CNP

## 2021-12-25 ENCOUNTER — Ambulatory Visit
Admit: 2021-12-25 | Discharge: 2021-12-25 | Payer: BLUE CROSS/BLUE SHIELD | Attending: Family Medicine | Primary: Family Medicine

## 2021-12-25 DIAGNOSIS — Z713 Dietary counseling and surveillance: Secondary | ICD-10-CM

## 2021-12-25 MED ORDER — PHENTERMINE HCL 37.5 MG PO CAPS
37.5 MG | ORAL_CAPSULE | Freq: Every morning | ORAL | 0 refills | Status: AC
Start: 2021-12-25 — End: 2022-01-24

## 2021-12-25 NOTE — Patient Instructions (Signed)
Start taking Lexapro 5 mg, two tablets daily  Refilled Phentermine x 30 days

## 2021-12-25 NOTE — Progress Notes (Addendum)
12/25/2021        Holly Long April 12, 1961 is a 61 y.o. female who presents today with:  Chief Complaint   Patient presents with   . Follow-up   . Medication Refill     Patient presents today for a follow and medication refill        HPI:   Holly Long presents today for weight loss follow-up.  She has been taking Adipex since March 2023.  Her initial weight was 281 pounds.  She is at 259 pounds today, up 2 pounds from 10/28/21.  She denies any side effects such as nausea, vomiting, constipation, diarrhea, blurry vision, dry mouth, insomnia, palpitations.  She is doing well and would like to continue.     She would like to increased her lexapro as she feels her anxiety can be better controlled. Patient denies any paranoia, visual or auditory hallucinations, suicidal or homicidal ideations.     Assessment & Plan   1. Encounter for weight loss counseling  -     phentermine 37.5 MG capsule; Take 1 capsule by mouth every morning for 30 days. Max Daily Amount: 37.5 mg, Disp-30 capsule, R-0Normal  2. Morbid obesity (HCC)  -     phentermine 37.5 MG capsule; Take 1 capsule by mouth every morning for 30 days. Max Daily Amount: 37.5 mg, Disp-30 capsule, R-0Normal  3. Anxiety and depression     There are no discontinued medications.  Return in about 1 month (around 01/24/2022).    Patient advised to increase lexapro to 10 mg daily  Refilled phentermine    Objective  Allergies   Allergen Reactions   . Latex      Current Outpatient Medications   Medication Sig Dispense Refill   . phentermine 37.5 MG capsule Take 1 capsule by mouth every morning for 30 days. Max Daily Amount: 37.5 mg 30 capsule 0   . tiZANidine (ZANAFLEX) 4 MG tablet Take 1 tablet by mouth nightly as needed (Muscle spasms) 30 tablet 2   . atorvastatin (LIPITOR) 20 MG tablet Take 1 tablet by mouth daily 90 tablet 4   . losartan (COZAAR) 100 MG tablet Take 1 tablet by mouth daily 90 tablet 4   . escitalopram (LEXAPRO) 5 MG tablet Take 1 tablet by mouth daily 90 tablet 4    . COVID-19 mRNA Vacc, Moderna, 100 MCG/0.5ML SUSP injection      . STELARA 90 MG/ML SOSY prefilled syringe      . Cholecalciferol (VITAMIN D) 50 MCG (2000 UT) CAPS capsule Take by mouth     . loratadine (CLARITIN) 10 MG capsule Take 1 capsule by mouth daily       No current facility-administered medications for this visit.       PMH, Surgical Hx, Family Hx, and Social Hx reviewed and updated.  Health Maintenance reviewed.    Vitals:    12/25/21 1049   BP: 130/78   Site: Left Upper Arm   Position: Sitting   Cuff Size: Large Adult   Pulse: 75   Temp: 97.9 F (36.6 C)   TempSrc: Temporal   SpO2: 96%   Weight: 259 lb (117.5 kg)   Height: 5\' 7"  (1.702 m)     BP Readings from Last 3 Encounters:   12/25/21 130/78   12/11/21 128/80   10/28/21 128/84     Wt Readings from Last 3 Encounters:   12/25/21 259 lb (117.5 kg)   12/11/21 255 lb (115.7 kg)   10/28/21 257 lb (116.6 kg)  Lab Results   Component Value Date    LABA1C 5.7 08/27/2021     Lab Results   Component Value Date    CREATININE 0.69 08/05/2020     Lab Results   Component Value Date    ALT 14 08/05/2020    AST 14 08/05/2020     Lab Results   Component Value Date    CHOL 181 08/05/2020    TRIG 197 (H) 08/05/2020    HDL 51 08/05/2020    LDLCALC 91 08/05/2020        Review of Systems   Physical Exam  Constitutional:       General: She is not in acute distress.     Appearance: Normal appearance. She is not toxic-appearing.   HENT:      Right Ear: External ear normal.      Left Ear: External ear normal.      Mouth/Throat:      Mouth: Mucous membranes are moist.   Eyes:      Extraocular Movements: Extraocular movements intact.      Pupils: Pupils are equal, round, and reactive to light.   Neck:      Vascular: No carotid bruit.   Cardiovascular:      Rate and Rhythm: Normal rate and regular rhythm.      Pulses: Normal pulses.      Heart sounds: Normal heart sounds.   Pulmonary:      Effort: No respiratory distress.      Breath sounds: No wheezing.   Abdominal:       General: Bowel sounds are normal.      Palpations: Abdomen is soft.      Tenderness: There is no abdominal tenderness.   Musculoskeletal:         General: Normal range of motion.   Skin:     General: Skin is warm and dry.   Neurological:      Mental Status: She is alert and oriented to person, place, and time. Mental status is at baseline.   Psychiatric:         Mood and Affect: Mood normal.         Behavior: Behavior normal.             Reviewed with the patient: current clinical status, medications, activities and diet.     Side effects, adverse effects of the medication prescribed today, as well as treatment plan/ rationale and result expectations have been discussed with the patient who expresses understanding and desires to proceed.    Close follow up to evaluate treatment results and for coordination of care.  I have reviewed the patient's medical history in detail and updated the computerized patient record.    Thermon Leyland, MD

## 2022-01-26 ENCOUNTER — Ambulatory Visit
Admit: 2022-01-26 | Discharge: 2022-01-26 | Payer: BLUE CROSS/BLUE SHIELD | Attending: Family Medicine | Primary: Family Medicine

## 2022-01-26 MED ORDER — PHENTERMINE HCL 37.5 MG PO CAPS
37.5 MG | ORAL_CAPSULE | Freq: Every morning | ORAL | 0 refills | Status: AC
Start: 2022-01-26 — End: 2022-02-25

## 2022-01-26 NOTE — Progress Notes (Signed)
01/26/2022        Holly Long 03/05/1961 is a 61 y.o. female who presents today with:  Chief Complaint   Patient presents with    Weight Management     1 month follow    Wrist Pain     Her wrist is still in a lot of pain         HPI:   Holly Long presents today for weight loss follow-up.  She has been taking Adipex since March 2023.  Her initial weight was 281 pounds.  She is 255 pounds today.  She denies any side effects such as nausea, vomiting, constipation, diarrhea, blurry vision, dry mouth, insomnia, palpitations.  She is doing well and would like to continue.     Left wrist pain- continuing for the past month and a half.  She is wearing a wrist brace.  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  She states her pain is in her hand on the fifth digit side.  She states it feels like sharp pains after she uses her hand only.    Assessment & Plan   1. Morbid obesity (HCC)  2. Encounter for weight loss counseling  3. Wrist pain, acute, left     There are no discontinued medications.  Return in about 1 month (around 02/26/2022) for Adipex.    Adipex refilled for 1 month  We discussed her left wrist pain and as the x-ray did not show a cause, the next up would be either EMG or orthopedic referral.  She prefers to wait before doing either.    Objective  Allergies   Allergen Reactions    Latex      Current Outpatient Medications   Medication Sig Dispense Refill    tiZANidine (ZANAFLEX) 4 MG tablet Take 1 tablet by mouth nightly as needed (Muscle spasms) 30 tablet 2    atorvastatin (LIPITOR) 20 MG tablet Take 1 tablet by mouth daily 90 tablet 4    losartan (COZAAR) 100 MG tablet Take 1 tablet by mouth daily 90 tablet 4    escitalopram (LEXAPRO) 5 MG tablet Take 1 tablet by mouth daily 90 tablet 4    STELARA 90 MG/ML SOSY prefilled syringe       Cholecalciferol (VITAMIN D) 50 MCG (2000 UT) CAPS capsule Take by mouth       loratadine (CLARITIN) 10 MG capsule Take 1 capsule by mouth daily      COVID-19 mRNA Vacc, Moderna, 100 MCG/0.5ML SUSP injection  (Patient not taking: Reported on 01/26/2022)       No current facility-administered medications for this visit.       PMH, Surgical Hx, Family Hx, and Social Hx reviewed and updated.  Health Maintenance reviewed.    Vitals:    01/26/22 1035   BP: 132/88   Site: Left Upper Arm   Position: Sitting   Cuff Size: Large Adult   Pulse: 73   SpO2: 98%   Weight: 255 lb 6.4 oz (115.8 kg)   Height: 5\' 7"  (1.702 m)     BP Readings from Last 3 Encounters:   01/26/22 132/88   12/25/21 130/78   12/11/21 128/80     Wt Readings from Last 3 Encounters:   01/26/22 255 lb 6.4 oz (115.8 kg)   12/25/21 259 lb (117.5 kg)   12/11/21 255 lb (115.7 kg)  Lab Results   Component Value Date    LABA1C 5.7 08/27/2021     Lab Results   Component Value Date    CREATININE 0.69 08/05/2020     Lab Results   Component Value Date    ALT 14 08/05/2020    AST 14 08/05/2020     Lab Results   Component Value Date    CHOL 181 08/05/2020    TRIG 197 (H) 08/05/2020    HDL 51 08/05/2020    LDLCALC 91 08/05/2020        Review of Systems   Physical Exam  Constitutional:       General: She is not in acute distress.     Appearance: Normal appearance. She is not toxic-appearing.   HENT:      Right Ear: External ear normal.      Left Ear: External ear normal.      Mouth/Throat:      Mouth: Mucous membranes are moist.   Eyes:      Extraocular Movements: Extraocular movements intact.      Pupils: Pupils are equal, round, and reactive to light.   Neck:      Vascular: No carotid bruit.   Cardiovascular:      Rate and Rhythm: Normal rate and regular rhythm.      Pulses: Normal pulses.      Heart sounds: Normal heart sounds.   Pulmonary:      Effort: No respiratory distress.      Breath sounds: No wheezing.   Abdominal:      General: Bowel sounds are normal.      Palpations: Abdomen is soft.      Tenderness: There is no abdominal tenderness.    Musculoskeletal:         General: Normal range of motion.      Comments: Left wrist in brace   Skin:     General: Skin is warm and dry.   Neurological:      Mental Status: She is alert and oriented to person, place, and time. Mental status is at baseline.   Psychiatric:         Mood and Affect: Mood normal.         Behavior: Behavior normal.             Reviewed with the patient: current clinical status, medications, activities and diet.     Side effects, adverse effects of the medication prescribed today, as well as treatment plan/ rationale and result expectations have been discussed with the patient who expresses understanding and desires to proceed.    Close follow up to evaluate treatment results and for coordination of care.  I have reviewed the patient's medical history in detail and updated the computerized patient record.    Thermon Leyland, MD

## 2022-01-26 NOTE — Patient Instructions (Signed)
Adipex refilled

## 2022-01-26 NOTE — Addendum Note (Signed)
Addended by: Ria Comment on: 01/26/2022 11:02 AM     Modules accepted: Level of Service

## 2022-02-25 ENCOUNTER — Encounter: Payer: BLUE CROSS/BLUE SHIELD | Attending: Family Medicine | Primary: Family Medicine

## 2022-02-27 ENCOUNTER — Encounter: Payer: BLUE CROSS/BLUE SHIELD | Attending: Family Medicine | Primary: Family Medicine

## 2022-03-27 ENCOUNTER — Encounter
Admit: 2022-03-27 | Discharge: 2022-03-27 | Payer: BLUE CROSS/BLUE SHIELD | Attending: Family Medicine | Primary: Family Medicine

## 2022-03-27 MED ORDER — ESCITALOPRAM OXALATE 5 MG PO TABS
5 MG | ORAL_TABLET | Freq: Every day | ORAL | 1 refills | Status: AC
Start: 2022-03-27 — End: 2022-06-25

## 2022-03-27 MED ORDER — PHENTERMINE HCL 37.5 MG PO CAPS
37.5 MG | ORAL_CAPSULE | Freq: Every morning | ORAL | 0 refills | Status: AC
Start: 2022-03-27 — End: 2022-04-26

## 2022-03-27 NOTE — Progress Notes (Addendum)
03/27/2022        Holly Long 10-27-1960 is a 61 y.o. female who presents today with:  Chief Complaint   Patient presents with    Weight Management    Anxiety     She has upped the medication to 15 mg        HPI:   Morbid Obesity - Holly Long presents today for Adipex refill.  She states that she went on a trip and ran out of medication.  She would like to resume.  She states that she has gained a few pounds back.  She denies any palpitations, nausea, or any other symptoms.    Anxiety/Depression - She would like to increase her Lexapro dose.  She was taking 5 mg dose and she states that she increased it to 10 mg for a few days and then she increased it to 15 mg for the past 3 weeks.  She states that she has noticed it does work better for her and she would like to stay on this dose. Patient denies any paranoia, visual or auditory hallucinations, suicidal or homicidal ideations.     She would also like a flu shot    Assessment & Plan   1. Morbid obesity (HCC)  -     phentermine 37.5 MG capsule; Take 1 capsule by mouth every morning for 30 days. Max Daily Amount: 37.5 mg, Disp-30 capsule, R-0Normal  2. Need for influenza vaccination  -     Influenza, FLUCELVAX, (age 17 mo+), IM, Preservative Free, 0.5 mL  3. Anxiety and depression  -     escitalopram (LEXAPRO) 5 MG tablet; Take 3 tablets by mouth daily, Disp-270 tablet, R-1Normal     Medications Discontinued During This Encounter   Medication Reason    phentermine (ADIPEX-P) 37.5 MG tablet Therapy completed    escitalopram (LEXAPRO) 5 MG tablet REORDER     No follow-ups on file.        Objective  Allergies   Allergen Reactions    Latex      Current Outpatient Medications   Medication Sig Dispense Refill    phentermine 37.5 MG capsule Take 1 capsule by mouth every morning for 30 days. Max Daily Amount: 37.5 mg 30 capsule 0    escitalopram (LEXAPRO) 5 MG tablet Take 3 tablets by mouth daily 270 tablet 1    tiZANidine (ZANAFLEX) 4 MG tablet Take 1 tablet by mouth nightly as  needed (Muscle spasms) 30 tablet 2    atorvastatin (LIPITOR) 20 MG tablet Take 1 tablet by mouth daily 90 tablet 4    losartan (COZAAR) 100 MG tablet Take 1 tablet by mouth daily 90 tablet 4    STELARA 90 MG/ML SOSY prefilled syringe       Cholecalciferol (VITAMIN D) 50 MCG (2000 UT) CAPS capsule Take by mouth      loratadine (CLARITIN) 10 MG capsule Take 1 capsule by mouth daily      COVID-19 mRNA Vacc, Moderna, 100 MCG/0.5ML SUSP injection  (Patient not taking: Reported on 01/26/2022)       No current facility-administered medications for this visit.       PMH, Surgical Hx, Family Hx, and Social Hx reviewed and updated.  Health Maintenance reviewed.    Vitals:    03/27/22 1002   BP: 122/86   Site: Left Upper Arm   Position: Sitting   Cuff Size: Large Adult   Pulse: 60   SpO2: 98%   Weight: 258 lb 3.2 oz (117.1  kg)   Height: 5\' 7"  (1.702 m)     BP Readings from Last 3 Encounters:   03/27/22 122/86   01/26/22 132/88   12/25/21 130/78     Wt Readings from Last 3 Encounters:   03/27/22 258 lb 3.2 oz (117.1 kg)   01/26/22 255 lb 6.4 oz (115.8 kg)   12/25/21 259 lb (117.5 kg)       Lab Results   Component Value Date    LABA1C 5.7 08/27/2021     Lab Results   Component Value Date    CREATININE 0.69 08/05/2020     Lab Results   Component Value Date    ALT 14 08/05/2020    AST 14 08/05/2020     Lab Results   Component Value Date    CHOL 181 08/05/2020    TRIG 197 (H) 08/05/2020    HDL 51 08/05/2020    LDLCALC 91 08/05/2020        Review of Systems   Physical Exam  Constitutional:       General: She is not in acute distress.     Appearance: Normal appearance. She is obese. She is not toxic-appearing.   HENT:      Right Ear: External ear normal.      Left Ear: External ear normal.      Mouth/Throat:      Mouth: Mucous membranes are moist.   Eyes:      Extraocular Movements: Extraocular movements intact.      Pupils: Pupils are equal, round, and reactive to light.   Neck:      Vascular: No carotid bruit.   Cardiovascular:       Rate and Rhythm: Normal rate and regular rhythm.      Pulses: Normal pulses.      Heart sounds: Normal heart sounds.   Pulmonary:      Effort: No respiratory distress.      Breath sounds: No wheezing.   Abdominal:      General: Bowel sounds are normal.      Palpations: Abdomen is soft.      Tenderness: There is no abdominal tenderness.   Musculoskeletal:         General: Normal range of motion.   Skin:     General: Skin is warm and dry.   Neurological:      Mental Status: She is alert and oriented to person, place, and time. Mental status is at baseline.   Psychiatric:         Mood and Affect: Mood normal.         Behavior: Behavior normal.               Reviewed with the patient: current clinical status, medications, activities and diet.     Side effects, adverse effects of the medication prescribed today, as well as treatment plan/ rationale and result expectations have been discussed with the patient who expresses understanding and desires to proceed.    Close follow up to evaluate treatment results and for coordination of care.  I have reviewed the patient's medical history in detail and updated the computerized patient record.    Thermon Leyland, MD

## 2022-04-21 NOTE — Telephone Encounter (Signed)
From: Josephine Igo  To: Dr. Fredia Beets Abukhater  Sent: 04/18/2022 3:34 AM EDT  Subject: Adipex    Hi Dr. Loni Muse,  I do not want to take Adipex any longer. It does not seem to be as strong as it first was. I also have had some trouble sleeping with it. I am going to continue eating better and will follow up with you in January. I will need to try something else at that time. Thank you.

## 2022-04-21 NOTE — Telephone Encounter (Signed)
FYI,

## 2022-04-22 NOTE — Telephone Encounter (Signed)
I cancelled your apt for 10/27.  Please call or schedule in mychart for the visit in January.

## 2022-04-22 NOTE — Telephone Encounter (Signed)
From: Evelene Croon  To: Josephine Igo  Sent: 04/21/2022 8:33 AM EDT  Subject: Pre-Check in for your appointment    MyChart Pre-Check allows you to check in for your appointment digitally. This needs to be completed prior to your appointment. Log into MyChart and under the visit select Pre-Check. Once you complete the Pre-Check on MyChart then when you arrive at the clinic you will only be asked your name and date of birth to verify your identity. We appreciate you and look forward to seeing you at your visit. If you have questions someone at our front desk can assist you. Thank you.

## 2022-04-24 ENCOUNTER — Encounter: Payer: BLUE CROSS/BLUE SHIELD | Attending: Family Medicine | Primary: Family Medicine

## 2022-06-25 ENCOUNTER — Encounter

## 2022-06-25 NOTE — Telephone Encounter (Signed)
Patient called resulted in Mammogram scheduled on 08/17/2022 @ 9:20 am [facility]

## 2022-08-17 ENCOUNTER — Inpatient Hospital Stay: Admit: 2022-08-17 | Payer: BLUE CROSS/BLUE SHIELD | Primary: Family Medicine

## 2022-08-17 ENCOUNTER — Encounter

## 2022-08-17 DIAGNOSIS — Z1231 Encounter for screening mammogram for malignant neoplasm of breast: Secondary | ICD-10-CM

## 2022-08-18 ENCOUNTER — Encounter

## 2022-08-20 ENCOUNTER — Encounter

## 2022-08-20 ENCOUNTER — Inpatient Hospital Stay: Admit: 2022-08-20 | Payer: BLUE CROSS/BLUE SHIELD | Primary: Family Medicine

## 2022-08-20 ENCOUNTER — Ambulatory Visit: Payer: BLUE CROSS/BLUE SHIELD | Primary: Family Medicine

## 2022-08-20 DIAGNOSIS — R928 Other abnormal and inconclusive findings on diagnostic imaging of breast: Secondary | ICD-10-CM

## 2022-09-29 NOTE — Telephone Encounter (Signed)
From: Holly Long  To: Dr. Fredia Beets Abukhater  Sent: 09/28/2022 8:32 PM EDT  Subject: My anxiety medicine    Hi Dr. Loni Muse,  I CURRENTLY TAKE 15 MLG OF ESCITALOPRAM. I WOULD LIKE TO UP IT. I HAVE A LOT GOING ON AND I feel anxious all the time. Can I get a prescription for 30 mlg? I have 270 pills 5 mlg I picked up today. Can I take 6 to make 30 mlg?  Thank you.  Holly Long

## 2022-09-30 ENCOUNTER — Ambulatory Visit: Admit: 2022-09-30 | Discharge: 2022-09-30 | Payer: BLUE CROSS/BLUE SHIELD | Primary: Family Medicine

## 2022-09-30 ENCOUNTER — Ambulatory Visit
Admit: 2022-09-30 | Discharge: 2022-09-30 | Payer: BLUE CROSS/BLUE SHIELD | Attending: Family Medicine | Primary: Family Medicine

## 2022-09-30 DIAGNOSIS — M79671 Pain in right foot: Secondary | ICD-10-CM

## 2022-09-30 DIAGNOSIS — Z0000 Encounter for general adult medical examination without abnormal findings: Secondary | ICD-10-CM

## 2022-09-30 LAB — POCT GLYCOSYLATED HEMOGLOBIN (HGB A1C): Hemoglobin A1C: 5.9 %

## 2022-09-30 MED ORDER — ESCITALOPRAM OXALATE 20 MG PO TABS
20 MG | ORAL_TABLET | Freq: Every day | ORAL | 5 refills | Status: AC
Start: 2022-09-30 — End: 2023-01-26

## 2022-09-30 NOTE — Progress Notes (Signed)
09/30/2022        Holly Long 06-22-1961 is a 62 y.o. female who presents today with:  Chief Complaint   Patient presents with    Anxiety    Medication Refill    Referral - General     Right lateral aspect of foot pain, request referral        HPI:   Anxiety/Depression - She would like to increase her Lexapro dose.  She has been taking 15 mg and reports that it is not working as well anymore.  Patient denies any paranoia, visual or auditory hallucinations, suicidal or homicidal ideations.     She states that she has pain on the lateral aspect of her right foot and she is requesting a referral. She noticed it after walking her dog for 2 weeks.  She has been taking Tylenol    Assessment & Plan   1. Routine adult health maintenance  -     POCT glycosylated hemoglobin (Hb A1C)  2. Anxiety and depression  -     escitalopram (LEXAPRO) 20 MG tablet; Take 1 tablet by mouth daily, Disp-30 tablet, R-5Normal  3. Muscle spasms of both lower extremities  4. Right foot pain  -     XR FOOT RIGHT (MIN 3 VIEWS); Future  5. Obesity, Class III, BMI 40-49.9 (morbid obesity) (HCC)     Medications Discontinued During This Encounter   Medication Reason    COVID-19 mRNA Vacc, Moderna, 100 MCG/0.5ML SUSP injection LIST CLEANUP    escitalopram (LEXAPRO) 5 MG tablet LIST CLEANUP     No follow-ups on file.    RICE therapy, x-ray as above, increased Lexapro to maximum dosage, next step would be to add BuSpar for anxiety.    Objective  Allergies   Allergen Reactions    Latex      Current Outpatient Medications   Medication Sig Dispense Refill    escitalopram (LEXAPRO) 20 MG tablet Take 1 tablet by mouth daily 30 tablet 5    tiZANidine (ZANAFLEX) 4 MG tablet Take 1 tablet by mouth nightly as needed (Muscle spasms) 30 tablet 2    atorvastatin (LIPITOR) 20 MG tablet Take 1 tablet by mouth daily 90 tablet 4    losartan (COZAAR) 100 MG tablet Take 1 tablet by mouth daily 90 tablet 4    STELARA 90 MG/ML SOSY prefilled syringe       Cholecalciferol  (VITAMIN D) 50 MCG (2000 UT) CAPS capsule Take by mouth      loratadine (CLARITIN) 10 MG capsule Take 1 capsule by mouth daily       No current facility-administered medications for this visit.       PMH, Surgical Hx, Family Hx, and Social Hx reviewed and updated.  Health Maintenance reviewed.    Vitals:    09/30/22 0953   BP: 120/82   Site: Right Upper Arm   Position: Sitting   Cuff Size: Large Adult   Pulse: 84   SpO2: 96%   Weight: 124.3 kg (274 lb)   Height: 1.702 m (5\' 7" )     BP Readings from Last 3 Encounters:   09/30/22 120/82   03/27/22 122/86   01/26/22 132/88     Wt Readings from Last 3 Encounters:   09/30/22 124.3 kg (274 lb)   03/27/22 117.1 kg (258 lb 3.2 oz)   01/26/22 115.8 kg (255 lb 6.4 oz)       Lab Results   Component Value Date    LABA1C 5.7  08/27/2021     Lab Results   Component Value Date    CREATININE 0.69 08/05/2020     Lab Results   Component Value Date    ALT 14 08/05/2020    AST 14 08/05/2020     Lab Results   Component Value Date    CHOL 181 08/05/2020    TRIG 197 (H) 08/05/2020    HDL 51 08/05/2020    LDLCALC 91 08/05/2020        Review of Systems   Physical Exam  Constitutional:       General: She is not in acute distress.     Appearance: Normal appearance. She is not toxic-appearing.   HENT:      Right Ear: External ear normal.      Left Ear: External ear normal.      Mouth/Throat:      Mouth: Mucous membranes are moist.   Eyes:      Extraocular Movements: Extraocular movements intact.      Pupils: Pupils are equal, round, and reactive to light.   Neck:      Vascular: No carotid bruit.   Cardiovascular:      Rate and Rhythm: Normal rate and regular rhythm.      Pulses: Normal pulses.      Heart sounds: Normal heart sounds.   Pulmonary:      Effort: No respiratory distress.      Breath sounds: No wheezing.   Abdominal:      General: Bowel sounds are normal.      Palpations: Abdomen is soft.      Tenderness: There is no abdominal tenderness.   Musculoskeletal:         General:  Tenderness (Right lateral foot distal) present. No swelling, deformity or signs of injury. Normal range of motion.   Skin:     General: Skin is warm and dry.   Neurological:      Mental Status: She is alert and oriented to person, place, and time. Mental status is at baseline.   Psychiatric:         Mood and Affect: Mood normal.         Behavior: Behavior normal.               Reviewed with the patient: current clinical status, medications, activities and diet.     Side effects, adverse effects of the medication prescribed today, as well as treatment plan/ rationale and result expectations have been discussed with the patient who expresses understanding and desires to proceed.    Close follow up to evaluate treatment results and for coordination of care.  I have reviewed the patient's medical history in detail and updated the computerized patient record.    Thermon LeylandSleiman S Isaih Bulger, MD

## 2022-09-30 NOTE — Telephone Encounter (Signed)
Patient called and wanted to know if she could come back up to get fitted for a boot for her foot?  She said she has a lot to do this week and it may help.  Please advise

## 2022-10-01 ENCOUNTER — Ambulatory Visit
Admit: 2022-10-01 | Discharge: 2022-10-01 | Payer: BLUE CROSS/BLUE SHIELD | Attending: Family Medicine | Primary: Family Medicine

## 2022-10-01 DIAGNOSIS — M79671 Pain in right foot: Secondary | ICD-10-CM

## 2022-10-01 NOTE — Progress Notes (Signed)
10/01/2022        Holly Long 18-Jun-1961 is a 62 y.o. female who presents today with:  Chief Complaint   Patient presents with    Follow-up     Needs boot for ankle stability        HPI:   Patient presents to follow-up on right foot injury and x-ray.  X-ray showed no significant fracture or dislocation, it did show bone spurs around the calcaneal region.  She states that she still has foot pain and she would like a boot.    Assessment & Plan   1. Right foot pain  -     [facility] Henrietta Dine, [employer] Hi [employer], Left; MD (M7-10/F8-11)  2. Mixed hyperlipidemia  -     Lipid, Fasting; Future     There are no discontinued medications.  No follow-ups on file.    Advised to use Tylenol as needed for any pain     Objective  Allergies   Allergen Reactions    Latex      Current Outpatient Medications   Medication Sig Dispense Refill    escitalopram (LEXAPRO) 20 MG tablet Take 1 tablet by mouth daily 30 tablet 5    tiZANidine (ZANAFLEX) 4 MG tablet Take 1 tablet by mouth nightly as needed (Muscle spasms) 30 tablet 2    atorvastatin (LIPITOR) 20 MG tablet Take 1 tablet by mouth daily 90 tablet 4    losartan (COZAAR) 100 MG tablet Take 1 tablet by mouth daily 90 tablet 4    STELARA 90 MG/ML SOSY prefilled syringe       Cholecalciferol (VITAMIN D) 50 MCG (2000 UT) CAPS capsule Take by mouth      loratadine (CLARITIN) 10 MG capsule Take 1 capsule by mouth daily       No current facility-administered medications for this visit.       PMH, Surgical Hx, Family Hx, and Social Hx reviewed and updated.  Health Maintenance reviewed.    Vitals:    10/01/22 1257   BP: 124/70   Site: Right Upper Arm   Position: Sitting   Cuff Size: Large Adult   Pulse: 84   SpO2: 96%   Weight: 124.3 kg (274 lb)   Height: 1.702 m (5\' 7" )     BP Readings from Last 3 Encounters:   10/01/22 124/70   09/30/22 120/82   03/27/22 122/86     Wt Readings from Last 3 Encounters:   10/01/22 124.3 kg (274 lb)   09/30/22 124.3 kg (274 lb)   03/27/22  117.1 kg (258 lb 3.2 oz)       Lab Results   Component Value Date    LABA1C 5.9 09/30/2022    LABA1C 5.7 08/27/2021     Lab Results   Component Value Date    CREATININE 0.69 08/05/2020     Lab Results   Component Value Date    ALT 14 08/05/2020    AST 14 08/05/2020     Lab Results   Component Value Date    CHOL 181 08/05/2020    TRIG 197 (H) 08/05/2020    HDL 51 08/05/2020    LDLCALC 91 08/05/2020        Review of Systems   Physical Exam  Constitutional:       General: She is not in acute distress.     Appearance: Normal appearance. She is not toxic-appearing.   HENT:      Right Ear: External ear  normal.      Left Ear: External ear normal.      Mouth/Throat:      Mouth: Mucous membranes are moist.   Eyes:      Extraocular Movements: Extraocular movements intact.      Pupils: Pupils are equal, round, and reactive to light.   Neck:      Vascular: No carotid bruit.   Cardiovascular:      Rate and Rhythm: Normal rate and regular rhythm.      Pulses: Normal pulses.      Heart sounds: Normal heart sounds.   Pulmonary:      Effort: No respiratory distress.      Breath sounds: No wheezing.   Abdominal:      General: Bowel sounds are normal.      Palpations: Abdomen is soft.      Tenderness: There is no abdominal tenderness.   Musculoskeletal:         General: Tenderness (Right lateral foot distal) present. No swelling, deformity or signs of injury. Normal range of motion.   Skin:     General: Skin is warm and dry.   Neurological:      Mental Status: She is alert and oriented to person, place, and time. Mental status is at baseline.   Psychiatric:         Mood and Affect: Mood normal.         Behavior: Behavior normal.               Reviewed with the patient: current clinical status, medications, activities and diet.     Side effects, adverse effects of the medication prescribed today, as well as treatment plan/ rationale and result expectations have been discussed with the patient who expresses understanding and desires to  proceed.    Close follow up to evaluate treatment results and for coordination of care.  I have reviewed the patient's medical history in detail and updated the computerized patient record.    Thermon LeylandSleiman S Corby Villasenor, MD

## 2022-10-01 NOTE — Telephone Encounter (Signed)
Scheduled appt to get boot

## 2022-11-06 ENCOUNTER — Encounter

## 2022-11-06 NOTE — Telephone Encounter (Signed)
Pt called she needs her next mammogram order put in the system for the 6 month check.   [facility]NO[facility] UNILATERAL RIGHT     THANKS

## 2022-11-13 ENCOUNTER — Encounter

## 2022-11-13 MED ORDER — LOSARTAN POTASSIUM 100 MG PO TABS
100 | ORAL_TABLET | Freq: Every day | ORAL | 4 refills | Status: DC
Start: 2022-11-13 — End: 2024-02-07

## 2022-11-13 MED ORDER — ATORVASTATIN CALCIUM 20 MG PO TABS
20 | ORAL_TABLET | Freq: Every day | ORAL | 4 refills | 90.00000 days | Status: DC
Start: 2022-11-13 — End: 2024-02-07

## 2022-11-18 ENCOUNTER — Encounter

## 2022-11-18 NOTE — Telephone Encounter (Signed)
Patient is aware of her results below.  Recall has been updated.    Judye Bos, Gem

## 2022-11-26 ENCOUNTER — Encounter

## 2022-12-02 ENCOUNTER — Ambulatory Visit
Admit: 2022-12-02 | Discharge: 2022-12-02 | Payer: BLUE CROSS/BLUE SHIELD | Attending: Family Medicine | Primary: Family Medicine

## 2022-12-02 ENCOUNTER — Ambulatory Visit: Admit: 2022-12-02 | Discharge: 2022-12-02 | Payer: BLUE CROSS/BLUE SHIELD | Primary: Family Medicine

## 2022-12-02 DIAGNOSIS — S99912A Unspecified injury of left ankle, initial encounter: Secondary | ICD-10-CM

## 2022-12-02 MED ORDER — KETOROLAC TROMETHAMINE 30 MG/ML IJ SOLN
30 | Freq: Once | INTRAMUSCULAR | Status: AC
Start: 2022-12-02 — End: 2022-12-02
  Administered 2022-12-02: 17:00:00 30 mg via INTRAMUSCULAR

## 2022-12-02 MED ORDER — TRAMADOL HCL 50 MG PO TABS
50 | ORAL_TABLET | ORAL | 0 refills | Status: AC | PRN
Start: 2022-12-02 — End: 2022-12-09

## 2022-12-02 NOTE — Addendum Note (Signed)
Addended by: Ria Comment on: 12/02/2022 04:58 PM     Modules accepted: Orders

## 2022-12-02 NOTE — Progress Notes (Addendum)
12/02/2022        Holly Long [birth year] is a 62 y.o. female who presents today with:  Chief Complaint   Patient presents with    Foot Pain     Patient states was walking in bedroom heard loud pop on left foot.  States was in a few days ago and was given referral to ortho but has not heard from them as of yet.         HPI:   Holly Long presents today as she states that she injured her left ankle.  She was walking in her bedroom and heard a loud pop on her left foot.  She has not taken any anti-inflammatories yet.  She is able to walk however she is in pain after a few steps.  She was recently referred to orthopedics for right ankle calcaneal spurs as well.    Assessment & Plan   1. Injury of left ankle, initial encounter  -     XR ANKLE LEFT (MIN 3 VIEWS); Future  -     ketorolac (TORADOL) injection 30 mg; 30 mg, IntraMUSCular, ONCE, 1 dose, On Wed 12/02/22 at 1345Do not administer for more than 5 days  -     traMADol (ULTRAM) 50 MG tablet; Take 1 tablet by mouth every 4 hours as needed for Pain for up to 7 days. Intended supply: 7 days. Take lowest dose possible to manage pain Max Daily Amount: 300 mg, Disp-42 tablet, R-0Normal     There are no discontinued medications.  No follow-ups on file.    PDMP reviewed and good     Objective  Allergies   Allergen Reactions    Latex      Current Outpatient Medications   Medication Sig Dispense Refill    traMADol (ULTRAM) 50 MG tablet Take 1 tablet by mouth every 4 hours as needed for Pain for up to 7 days. Intended supply: 7 days. Take lowest dose possible to manage pain Max Daily Amount: 300 mg 42 tablet 0    losartan (COZAAR) 100 MG tablet TAKE 1 TABLET BY MOUTH EVERY DAY 90 tablet 4    atorvastatin (LIPITOR) 20 MG tablet TAKE 1 TABLET BY MOUTH EVERY DAY 90 tablet 4    escitalopram (LEXAPRO) 20 MG tablet Take 1 tablet by mouth daily 30 tablet 5    tiZANidine (ZANAFLEX) 4 MG tablet Take 1 tablet by mouth nightly as needed (Muscle spasms) 30 tablet 2    STELARA 90 MG/ML SOSY  prefilled syringe       Cholecalciferol (VITAMIN D) 50 MCG (2000 UT) CAPS capsule Take by mouth      loratadine (CLARITIN) 10 MG capsule Take 1 capsule by mouth daily       No current facility-administered medications for this visit.       PMH, Surgical Hx, Family Hx, and Social Hx reviewed and updated.  Health Maintenance reviewed.    Vitals:    12/02/22 1245   BP: 128/80   Site: Right Upper Arm   Position: Sitting   Cuff Size: Large Adult   Pulse: 89   SpO2: 98%   Weight: 123.4 kg (272 lb)   Height: 1.702 m (5\' 7" )     BP Readings from Last 3 Encounters:   12/02/22 128/80   10/01/22 124/70   09/30/22 120/82     Wt Readings from Last 3 Encounters:   12/02/22 123.4 kg (272 lb)   10/01/22 124.3 kg (274 lb)   09/30/22 124.3 kg (  274 lb)       Lab Results   Component Value Date    LABA1C 5.9 09/30/2022    LABA1C 5.7 08/27/2021     Lab Results   Component Value Date    CREATININE 0.69 08/05/2020     Lab Results   Component Value Date    ALT 14 08/05/2020    AST 14 08/05/2020     Lab Results   Component Value Date    CHOL 181 08/05/2020    TRIG 197 (H) 08/05/2020    HDL 51 08/05/2020        Review of Systems   Physical Exam  Constitutional:       General: She is not in acute distress.     Appearance: Normal appearance. She is not toxic-appearing.   HENT:      Right Ear: External ear normal.      Left Ear: External ear normal.      Mouth/Throat:      Mouth: Mucous membranes are moist.   Eyes:      Extraocular Movements: Extraocular movements intact.      Pupils: Pupils are equal, round, and reactive to light.   Neck:      Vascular: No carotid bruit.   Cardiovascular:      Rate and Rhythm: Normal rate and regular rhythm.      Pulses: Normal pulses.      Heart sounds: Normal heart sounds.   Pulmonary:      Effort: No respiratory distress.      Breath sounds: No wheezing.   Abdominal:      General: Bowel sounds are normal.      Palpations: Abdomen is soft.      Tenderness: There is no abdominal tenderness.   Musculoskeletal:          General: Tenderness (Left medial ankle, left medial foot, pain on inversion of the left ankle) present. Normal range of motion.   Skin:     General: Skin is warm and dry.   Neurological:      Mental Status: She is alert and oriented to person, place, and time. Mental status is at baseline.   Psychiatric:         Mood and Affect: Mood normal.         Behavior: Behavior normal.               Reviewed with the patient: current clinical status, medications, activities and diet.     Side effects, adverse effects of the medication prescribed today, as well as treatment plan/ rationale and result expectations have been discussed with the patient who expresses understanding and desires to proceed.    Close follow up to evaluate treatment results and for coordination of care.  I have reviewed the patient's medical history in detail and updated the computerized patient record.    Thermon Leyland, MD

## 2022-12-19 ENCOUNTER — Encounter

## 2022-12-22 NOTE — Telephone Encounter (Signed)
Pt no longer takes 5 mg

## 2022-12-23 ENCOUNTER — Encounter: Payer: BLUE CROSS/BLUE SHIELD | Attending: Physician Assistant | Primary: Family Medicine

## 2023-01-06 ENCOUNTER — Ambulatory Visit
Admit: 2023-01-06 | Discharge: 2023-01-06 | Payer: BLUE CROSS/BLUE SHIELD | Attending: Physician Assistant | Primary: Family Medicine

## 2023-01-06 DIAGNOSIS — S93602A Unspecified sprain of left foot, initial encounter: Secondary | ICD-10-CM

## 2023-01-06 MED ORDER — TRIAMCINOLONE ACETONIDE 40 MG/ML IJ SUSP
40 | Freq: Once | INTRAMUSCULAR | Status: AC
Start: 2023-01-06 — End: 2023-01-06
  Administered 2023-01-06: 18:00:00 40 mg via INTRA_ARTICULAR

## 2023-01-06 MED ORDER — LIDOCAINE HCL 1 % IJ SOLN
1 | Freq: Once | INTRAMUSCULAR | Status: AC
Start: 2023-01-06 — End: 2023-01-06
  Administered 2023-01-06: 18:00:00 1 mL via INTRA_ARTICULAR

## 2023-01-06 NOTE — Progress Notes (Signed)
Holly Long (DOB:  05/05/1961) is a 62 y.o. female,New patient, consult from Dr. Harold Barban here for evaluation of the following chief complaint(s):  Foot Pain (Patient presents for left foot pain. Present for approximately 1 month. Pain is currently 5/10. )      Assessment & Plan   1. Foot sprain, left, initial encounter  -     lidocaine 1 % injection 1 mL; 1 mL, Intra-artICUlar, ONCE, 1 dose, On Wed 01/06/23 at 1400  -     triamcinolone acetonide (KENALOG-40) injection 40 mg; 40 mg, Intra-artICUlar, ONCE, 1 dose, On Wed 01/06/23 at 1400  -     PR ARTHROCENTESIS ASPIR&/INJ INTERM JT/BURS W/O [place]  2. Acquired pronation deformity of foot, left      No follow-ups on file.       Subjective   This is a 62 year old female complaining of left foot pain.  She states April 2024 she was doing a lot of dancing when she was in [place].  She states shortly thereafter she developed left foot pain.  She was seen by primary care and they ordered x-rays.  X-rays do not show any acute fracture.  Patient does have a fracture of the distal tibial and fibula of the left lower leg.  She has been in a postop shoe but has not noticed any significant improvement.  She states she has insoles for her shoes but she has not been wearing them much lately.  She denies change in sensation of the left foot.  States initially she had difficulty dorsiflexing her second and third toes but that has resolved.        Review of Systems   Constitutional: Negative.    HENT: Negative.     Eyes: Negative.    Respiratory: Negative.     Gastrointestinal: Negative.    Genitourinary: Negative.    Musculoskeletal: Negative.    Psychiatric/Behavioral: Negative.            Objective   Physical Exam  Constitutional:       Appearance: Normal appearance.   HENT:      Head: Normocephalic and atraumatic.      Mouth/Throat:      Mouth: Mucous membranes are moist.   Eyes:      Extraocular Movements: Extraocular movements intact.   Musculoskeletal:      Cervical back:  Normal range of motion.      Comments: Left foot-no swelling or ecchymosis.  Patient dorsiflex her toes without difficulty.  Tenderness with palpation over the proximal fifth metatarsal.  Anterior aspect of the calcaneus is nontender.  Patient curls her toes without difficulty.  Sensation is intact distally to light   Skin:     General: Skin is warm and dry.   Neurological:      General: No focal deficit present.      Mental Status: She is oriented to person, place, and time.   Psychiatric:         Mood and Affect: Mood normal.     Procedure-after sterile prep and under aseptic technique 1 cc 1% lidocaine and 1 cc 40 mg/mL Kenalog are injected into the proximal fifth metatarsal joint and surrounding tissue.  Patient tolerated procedure well.     Explained to the patient that she does have a foot sprain.  She has some degenerative arthritis of the foot.  I injected fifth metatarsal proximal joint with cortisone.  Encourage patient to go into a pair tennis shoes with a nice arch support.  I encouraged her to purchase power stride shoe insoles.  On this date 01/06/2023 I have spent 35 minutes reviewing previous notes, test results and face to face with the patient discussing the diagnosis and importance of compliance with the treatment plan as well as documenting on the day of the visit.      An electronic signature was used to authenticate this note.    --Donnella Bi, PA

## 2023-01-26 ENCOUNTER — Encounter

## 2023-01-26 MED ORDER — ESCITALOPRAM OXALATE 20 MG PO TABS
20 MG | ORAL_TABLET | Freq: Every day | ORAL | 0 refills | Status: DC
Start: 2023-01-26 — End: 2023-08-04

## 2023-02-10 ENCOUNTER — Encounter: Payer: BLUE CROSS/BLUE SHIELD | Attending: Family Medicine | Primary: Family Medicine

## 2023-02-12 ENCOUNTER — Encounter

## 2023-02-12 ENCOUNTER — Encounter
Admit: 2023-02-12 | Discharge: 2023-02-12 | Payer: BLUE CROSS/BLUE SHIELD | Attending: Family Medicine | Primary: Family Medicine

## 2023-02-12 DIAGNOSIS — I10 Essential (primary) hypertension: Secondary | ICD-10-CM

## 2023-02-12 LAB — COMPREHENSIVE METABOLIC PANEL
ALT: 15 U/L (ref 0–33)
AST: 16 U/L (ref 0–35)
Albumin: 4.3 g/dL (ref 3.5–4.6)
Alkaline Phosphatase: 97 U/L (ref 40–130)
Anion Gap: 10 meq/L (ref 9–15)
BUN: 19 mg/dL (ref 8–23)
CO2: 28 meq/L (ref 20–31)
Calcium: 9.2 mg/dL (ref 8.5–9.9)
Chloride: 104 meq/L (ref 95–107)
Creatinine: 0.73 mg/dL (ref 0.50–0.90)
Est, Glom Filt Rate: >90 (ref >60)
Globulin: 3.4 g/dL (ref 2.3–3.5)
Glucose: 84 mg/dL (ref 70–99)
Potassium: 4.8 meq/L (ref 3.4–4.9)
Sodium: 142 meq/L (ref 135–144)
Total Bilirubin: 0.8 mg/dL — ABNORMAL HIGH (ref 0.2–0.7)
Total Protein: 7.7 g/dL (ref 6.3–8.0)

## 2023-02-12 LAB — LIPID PANEL
Cholesterol, Total: 169 mg/dL (ref 0–199)
HDL: 46 mg/dL (ref 40–59)
LDL Cholesterol: 98 mg/dL (ref 0–129)
Triglycerides: 124 mg/dL (ref 0–150)

## 2023-02-12 NOTE — Progress Notes (Signed)
"  Have you been to the ER, urgent care clinic since your last visit?  Hospitalized since your last visit?"    NO    "Have you seen or consulted any other health care providers outside of [facility] since your last visit?"    YES - When: approximately   ago.  Where and Why: [id number].    [facility]          Click Here for Release of Records Request

## 2023-02-12 NOTE — Progress Notes (Addendum)
02/12/2023        Holly Long [birth year] is a 62 y.o. female who presents today with:  Chief Complaint   Patient presents with    Weight Management     Zepobound or Wegovy.  States left ear is clogged.         HPI: Follow-up  HTN  controlled on current treatment. Reports no associated complaints. Adhering to a low salt diet; also trying to make healthy lifestyle changes.      HLD  Controlled on statin therapy. As above, ongoing healthy lifestyle changes.     IBD  Crohn's Disease. Follows with GI on Stelara (Dr. Tiburcio Pea) at [facility]. Controlled on current treatment.      Muscle spasms b/l Les  Experiences intermittent muscle spasms in this setting which is well controlled on Tizanidine as needed.      Anxiety/depression  Feels her anxiety is well controlled on Lexapro.  She is aware of potential weight gain and would like to continue the medication.  No associated complaints today.  Denies any paranoia, visual or auditory hallucinations, suicidal or homicidal ideations.     Morbid obesity  She has failed Adipex and weight watchers.  She completed weight watchers for 6 months and did not lose any weight.  Exercising for the past 6 months has not helped lose any weight either.     She states that her left ear is muffled.    Health Maintenance  Colonoscopy: GI follows and does every 2 years  Pap smear: due 08/28/23  Mammogram: due 08/13/22  DEXA: due 10/18/25  PNA VAX: Had 2 years ago  Shingles VAX: done  Tetanus VAX: 04/06/29    Assessment & Plan   1. Benign essential HTN  2. Mixed hyperlipidemia  -     Lipid Panel; Future  3. IBD (inflammatory bowel disease)  4. Morbid obesity (HCC)  -     Comprehensive Metabolic Panel; Future  5. MDD (major depressive disorder), recurrent, in partial remission (HCC)  6. GAD (generalized anxiety disorder)  7. Muscle spasms of both lower extremities  8. Screening for diabetes mellitus  -     Hemoglobin A1C; Future     There are no discontinued medications.  No follow-ups on file.    She would  like to do Zepbound however documentation of labs as above need to be obtained first.    Advised to check blood pressure at home and let me know the results.    Objective  Allergies   Allergen Reactions    Latex      Current Outpatient Medications   Medication Sig Dispense Refill    escitalopram (LEXAPRO) 20 MG tablet TAKE 1 TABLET BY MOUTH EVERY DAY 90 tablet 0    losartan (COZAAR) 100 MG tablet TAKE 1 TABLET BY MOUTH EVERY DAY 90 tablet 4    atorvastatin (LIPITOR) 20 MG tablet TAKE 1 TABLET BY MOUTH EVERY DAY 90 tablet 4    tiZANidine (ZANAFLEX) 4 MG tablet Take 1 tablet by mouth nightly as needed (Muscle spasms) 30 tablet 2    STELARA 90 MG/ML SOSY prefilled syringe       Cholecalciferol (VITAMIN D) 50 MCG (2000 UT) CAPS capsule Take by mouth      loratadine (CLARITIN) 10 MG capsule Take 1 capsule by mouth daily       No current facility-administered medications for this visit.       PMH, Surgical Hx, Family Hx, and Social Hx reviewed and updated.  Health Maintenance reviewed.    Vitals:    02/12/23 1039 02/12/23 1047   BP: (!) 142/98 138/88   Site: Left Upper Arm Left Upper Arm   Position: Sitting Sitting   Cuff Size: Large Adult Large Adult   Pulse: 50    SpO2: 98%    Weight: 124.5 kg (274 lb 6.4 oz)    Height: 1.702 m (5\' 7" )      BP Readings from Last 3 Encounters:   02/12/23 138/88   12/02/22 128/80   10/01/22 124/70     Wt Readings from Last 3 Encounters:   02/12/23 124.5 kg (274 lb 6.4 oz)   01/06/23 117.9 kg (260 lb)   12/02/22 123.4 kg (272 lb)       Lab Results   Component Value Date    LABA1C 5.9 09/30/2022    LABA1C 5.7 08/27/2021     Lab Results   Component Value Date    CREATININE 0.69 08/05/2020     Lab Results   Component Value Date    ALT 14 08/05/2020    AST 14 08/05/2020     Lab Results   Component Value Date    CHOL 181 08/05/2020    TRIG 197 (H) 08/05/2020    HDL 51 08/05/2020        Review of Systems   Physical Exam  Constitutional:       General: She is not in acute distress.      Appearance: Normal appearance. She is obese. She is not toxic-appearing.   HENT:      Right Ear: External ear normal.      Left Ear: External ear normal.      Mouth/Throat:      Mouth: Mucous membranes are moist.   Eyes:      Extraocular Movements: Extraocular movements intact.      Pupils: Pupils are equal, round, and reactive to light.   Cardiovascular:      Rate and Rhythm: Normal rate and regular rhythm.      Pulses: Normal pulses.      Heart sounds: Normal heart sounds.   Pulmonary:      Effort: No respiratory distress.      Breath sounds: No wheezing.   Abdominal:      General: Bowel sounds are normal.      Palpations: Abdomen is soft.   Musculoskeletal:         General: Normal range of motion.   Skin:     General: Skin is warm and dry.   Neurological:      Mental Status: She is alert and oriented to person, place, and time. Mental status is at baseline.   Psychiatric:         Mood and Affect: Mood normal.         Behavior: Behavior normal.               Reviewed with the patient: current clinical status, medications, activities and diet.     Side effects, adverse effects of the medication prescribed today, as well as treatment plan/ rationale and result expectations have been discussed with the patient who expresses understanding and desires to proceed.    Close follow up to evaluate treatment results and for coordination of care.  I have reviewed the patient's medical history in detail and updated the computerized patient record.    Thermon Leyland, MD

## 2023-02-13 LAB — HEMOGLOBIN A1C
Estimated Avg Glucose: 111 mg/dL
Hemoglobin A1C: 5.5 % (ref 4.0–6.0)

## 2023-02-16 NOTE — Telephone Encounter (Signed)
Holly Long  P [facility] Clinical Staff (supporting Sleiman S Abukhater, MD)19 hours ago (8:26 PM)     VN  Hi Dr A.  Just checking to see if you submitted info to insurance for Zepbound.  I assume it takes a few days to approve.      Please advise if I need to do PA for Zepbound.

## 2023-02-17 ENCOUNTER — Encounter

## 2023-02-17 MED ORDER — TIRZEPATIDE-WEIGHT MANAGEMENT 2.5 MG/0.5ML SC SOAJ
2.5 MG/0.5ML | SUBCUTANEOUS | 2 refills | Status: DC
Start: 2023-02-17 — End: 2023-04-06

## 2023-02-17 NOTE — Telephone Encounter (Signed)
ERROR

## 2023-02-19 ENCOUNTER — Inpatient Hospital Stay: Admit: 2023-02-19 | Payer: BLUE CROSS/BLUE SHIELD | Primary: Family Medicine

## 2023-02-19 ENCOUNTER — Inpatient Hospital Stay: Payer: BLUE CROSS/BLUE SHIELD | Primary: Family Medicine

## 2023-02-19 DIAGNOSIS — R928 Other abnormal and inconclusive findings on diagnostic imaging of breast: Secondary | ICD-10-CM

## 2023-02-22 NOTE — Telephone Encounter (Signed)
Pt is calling back to check on the status of the prior authorization for the Zepbound.  Please advise.  Pt states it has been two weeks.  Please call patient back to let her know at [phone]

## 2023-03-02 NOTE — Telephone Encounter (Addendum)
 Pt states her insurance denied the zepbound , said that her office notes didn't show that she has tried anything else.  She said she has discussed with you that she did weight watchers this year from 2/24-5/24, but is a lifetime member, and also her last rx for adipex was 03/27/22.  Can you please addend note and add these bits of info and then have [place] refax.  Information is in careers information officer, she sent the original.  She has also been walking and exercising.

## 2023-03-04 NOTE — Telephone Encounter (Signed)
 Lmom regarding message, and that she would probably be best of doing the program her insurance company recommends and that would meet all 3 parameters, and will cover the 6 months necessary.  Advised if she had any problems or questions she could call the office to discuss.

## 2023-03-05 NOTE — Telephone Encounter (Signed)
 Received a  call from [employer]  Catering Manager) with patient on the line regarding prior authorization for Rx Tirzepatide -Weight Management 2.5 MG/0.5ML SOAJ.     Caller stated if possible that staff may resubmit prior authorization,stated it is missing information but no need to fax all information just the following requested as well any updated record with patient demographics and case number attached.    February - August 2024 - (Six Months)  Weight Watchers with diet, exercise and behavioral therapy,physical activity.    June-August 2024 - Adipex (Three Months)    Case# [id number]  Fax#[phone]    Please attach patient case number with patient demographics and any updated records. Patient would like to receive a curtesy call when fax is sent.    Thank you

## 2023-03-08 NOTE — Telephone Encounter (Signed)
 Prior Auth was faxed on Friday morning

## 2023-03-09 NOTE — Telephone Encounter (Signed)
 Pt called in reference to the Tirzepatide -Weight Management medication and the prior authorization.    Pt stated that the paperwork is missing important information.    Pt stated that from January 2024 to August 2024 she was on Weight Watchers and that information need to be on the prior authorization paperwork.    Pt stated that those dates are the only information missing from the paperwork and she would like it added and the paperwork to be resent.    Pt requests that once this is done, someone either call her or put an updated message in MyChart.

## 2023-03-09 NOTE — Telephone Encounter (Signed)
 Zepbound  denied again.  Patient states weight watchers needs to be added to her notes and timeframe     Holly Long I am so frustrated I talked to [facility] and was denied again because no dates were on the [employer].  Please add Jan 2024 to Aug 2024 to weight watchers and resend asap.  I am sorry if this sounds harsh I don't mean it to be.  I know you ladies are busy and I appreciate you.    Thank you please let me know when you send.    Please advise.

## 2023-03-09 NOTE — Telephone Encounter (Signed)
 Already sent mychart message to Dr A to update dates in notes

## 2023-03-12 ENCOUNTER — Encounter

## 2023-03-12 NOTE — Telephone Encounter (Signed)
 Pt called and stated that her Zepbound has been approved but th pharmacy told her nothing was called in.    Pharmacy is [facility] in [place] on [address]

## 2023-04-06 ENCOUNTER — Encounter

## 2023-04-06 MED ORDER — ZEPBOUND 5 MG/0.5ML SC SOAJ
50.5 MG/0.ML | SUBCUTANEOUS | 5 refills | Status: AC
Start: 2023-04-06 — End: 2023-05-17

## 2023-04-13 ENCOUNTER — Ambulatory Visit
Admit: 2023-04-13 | Discharge: 2023-04-13 | Payer: BLUE CROSS/BLUE SHIELD | Attending: Student in an Organized Health Care Education/Training Program | Primary: Family Medicine

## 2023-04-13 DIAGNOSIS — J069 Acute upper respiratory infection, unspecified: Secondary | ICD-10-CM

## 2023-04-13 MED ORDER — METHYLPREDNISOLONE 4 MG PO TBPK
4 | PACK | ORAL | 0 refills | Status: AC
Start: 2023-04-13 — End: 2023-04-19

## 2023-04-13 MED ORDER — AZITHROMYCIN 250 MG PO TABS
250 | ORAL_TABLET | ORAL | 0 refills | Status: AC
Start: 2023-04-13 — End: 2023-04-23

## 2023-04-13 NOTE — Assessment & Plan Note (Signed)
Chronic: on zepbound   267lbs

## 2023-04-13 NOTE — Progress Notes (Signed)
[facility]ICAL [facility]  [facility]  [address]  [place] [address]  Dept: [phone]  Dept Fax: [phone]  Loc: [phone]     04/13/2023    Visit type: Acute care    Reason for Visit: Congestion (1.5 weeks congestion went down to chest no body aches or chills no fever )       ASSESSMENT/PLAN   1. Upper respiratory tract infection, unspecified type  -     azithromycin (ZITHROMAX) 250 MG tablet; 500mg  on day 1 followed by 250mg  on days 2 - 5, Disp-6 tablet, R-0Normal  -     methylPREDNISolone (MEDROL DOSEPACK) 4 MG tablet; Take by mouth., Disp-1 kit, R-0Normal  2. Benign essential HTN  Assessment & Plan:  Chronic: stable   - BP today of 118/76  3. Class 3 severe obesity due to excess calories without serious comorbidity with body mass index (BMI) of 40.0 to 44.9 in adult  Assessment & Plan:  Chronic: on zepbound   267lbs         No follow-ups on file.  Orders Placed This Encounter   Medications    azithromycin (ZITHROMAX) 250 MG tablet     Sig: 500mg  on day 1 followed by 250mg  on days 2 - 5     Dispense:  6 tablet     Refill:  0    methylPREDNISolone (MEDROL DOSEPACK) 4 MG tablet     Sig: Take by mouth.     Dispense:  1 kit     Refill:  0      Subjective    Patient: Holly Long is a 62 y.o. female     HPI: URI symptoms  - HPI: got a flu shot a couple of weeks ago, shortly after that started having scratchy throat. Feels like it settled in her chest now. Hears herself wheezing around bed time.   - onset: 1.5 weeks ago  - Symptoms: no, scratchy sore throat, not a lot: nonproductive cough, yes: runny clear nasal congestion, yes post nasal drip, no fevers, no chills  - History of: yes allergies, no asthma, quit 7 years ago smoker   - Sick contacts no   - OTC meds: dayquil, nyquil      Review of Systems   Objective     Vitals:    04/13/23 0815   BP: 118/76   Site: Left Upper Arm   Position: Sitting   Cuff Size: Large Adult   Pulse: 72   Temp:  98.7 F (37.1 C)   TempSrc: Oral   SpO2: 98%   Weight: 121.3 kg (267 lb 6.4 oz)   Height: 1.702 m (5\' 7" )     Physical Exam  Vitals reviewed.   Constitutional:       General: She is not in acute distress.     Appearance: Normal appearance. She is not ill-appearing.   Pulmonary:      Effort: Pulmonary effort is normal.      Breath sounds: Wheezing (in all lung fields) present.   Neurological:      Mental Status: She is alert.       Allergies   Allergen Reactions    Latex        Current Outpatient Medications:     azithromycin (ZITHROMAX) 250 MG tablet, 500mg  on day 1 followed by 250mg  on days 2 - 5, Disp: 6 tablet, Rfl: 0    methylPREDNISolone (MEDROL DOSEPACK) 4 MG  tablet, Take by mouth., Disp: 1 kit, Rfl: 0    Tirzepatide-Weight Management (ZEPBOUND) 5 MG/0.5ML SOAJ, Inject 5 mg into the skin once a week, Disp: 2 mL, Rfl: 5    escitalopram (LEXAPRO) 20 MG tablet, TAKE 1 TABLET BY MOUTH EVERY DAY, Disp: 90 tablet, Rfl: 0    losartan (COZAAR) 100 MG tablet, TAKE 1 TABLET BY MOUTH EVERY DAY, Disp: 90 tablet, Rfl: 4    atorvastatin (LIPITOR) 20 MG tablet, TAKE 1 TABLET BY MOUTH EVERY DAY, Disp: 90 tablet, Rfl: 4    tiZANidine (ZANAFLEX) 4 MG tablet, Take 1 tablet by mouth nightly as needed (Muscle spasms), Disp: 30 tablet, Rfl: 2    STELARA 90 MG/ML SOSY prefilled syringe, , Disp: , Rfl:     Cholecalciferol (VITAMIN D) 50 MCG (2000 UT) CAPS capsule, Take by mouth, Disp: , Rfl:     loratadine (CLARITIN) 10 MG capsule, Take 1 capsule by mouth daily, Disp: , Rfl:    Past Medical History:   Diagnosis Date    Acute Crohn's disease (HCC)     Hyperlipidemia     Hypertension      Past Surgical History:   Procedure Laterality Date    CARPAL TUNNEL RELEASE      CESAREAN SECTION      TMJ ARTHROPLASTY Left      Family History   Problem Relation Age of Onset    High Blood Pressure Mother     High Blood Pressure Father     Breast Cancer Neg Hx      Social History     Tobacco Use    Smoking status: Former     Current packs/day: 0.00      Average packs/day: 0.5 packs/day for 10.0 years (5.0 ttl pk-yrs)     Types: Cigarettes     Start date: 06/30/1971     Quit date: 06/29/1981     Years since quitting: 41.8     Passive exposure: Never    Smokeless tobacco: Never   Substance Use Topics    Alcohol use: Yes     Alcohol/week: 1.0 - 2.0 standard drink of alcohol     Types: 1 - 2 Glasses of wine per week       Attestation    # PMH, Surgical Hx, Family Hx, Social Hx, allergies and current medications reviewed and updated the computerized patient record.  # Health Maintenance reviewed.  # 3+ Prior external notes from unique source reviewed  # Reviewed with the patient: current clinical status, activities and diet.  # Previous labs and imaging reviewed.   # Side effects, adverse effects of the medication prescribed today, as well as treatment plan/ rationale and result expectations have been discussed with the patient who expresses understanding and desires to proceed.  # Close follow up to evaluate treatment results and for coordination of care.    Retia Passe, MD

## 2023-04-13 NOTE — Assessment & Plan Note (Signed)
Chronic: stable   - BP today of 118/76

## 2023-04-28 ENCOUNTER — Encounter

## 2023-04-28 MED ORDER — AZITHROMYCIN 500 MG PO TABS
500 | ORAL_TABLET | Freq: Every day | ORAL | 0 refills | Status: AC
Start: 2023-04-28 — End: 2023-05-01

## 2023-05-17 ENCOUNTER — Encounter: Admit: 2023-05-17 | Admitting: Family Medicine

## 2023-05-17 DIAGNOSIS — Z6841 Body Mass Index (BMI) 40.0 and over, adult: Principal | ICD-10-CM

## 2023-05-17 DIAGNOSIS — E66813 Obesity, class 3 (HCC): Secondary | ICD-10-CM

## 2023-05-17 MED ORDER — ZEPBOUND 7.5 MG/0.5ML SC SOAJ
7.50.5 MG/0.5ML | SUBCUTANEOUS | 2 refills | Status: DC
Start: 2023-05-17 — End: 2023-08-24

## 2023-05-31 ENCOUNTER — Ambulatory Visit
Admit: 2023-05-31 | Discharge: 2023-05-31 | Payer: BLUE CROSS/BLUE SHIELD | Attending: Student in an Organized Health Care Education/Training Program | Admitting: Student in an Organized Health Care Education/Training Program | Primary: Family Medicine

## 2023-05-31 VITALS — BP 115/80 | HR 82 | Temp 97.40000°F | Resp 15 | Ht 67.0 in | Wt 267.0 lb

## 2023-05-31 DIAGNOSIS — H6123 Impacted cerumen, bilateral: Secondary | ICD-10-CM

## 2023-05-31 NOTE — Progress Notes (Signed)
 [facility] SPECIAL[facility]ARE, [facility] - [place] [facility]  [address]  [place] [address]  Dept: [phone]  Dept Fax: [phone]  Loc: [phone]     05/31/2023    Visit type: New patient    Reason for

## 2023-06-03 ENCOUNTER — Encounter: Admit: 2023-06-03 | Payer: BLUE CROSS/BLUE SHIELD | Admitting: Physician Assistant | Primary: Family Medicine

## 2023-06-03 VITALS — HR 79 | Temp 97.30000°F | Ht 66.0 in | Wt 248.0 lb

## 2023-06-03 DIAGNOSIS — M5126 Other intervertebral disc displacement, lumbar region: Principal | ICD-10-CM

## 2023-06-03 MED ORDER — PREDNISONE 20 MG PO TABS
20 | ORAL_TABLET | Freq: Every day | ORAL | 0 refills | Status: AC
Start: 2023-06-03 — End: 2023-06-10

## 2023-06-03 NOTE — Progress Notes (Signed)
 Holly Long (DOB:  [birth year]) is a 62 y.o. female,Established patient, here for evaluation of the following chief complaint(s):  Follow-up (Left ankle pain. Pain is currently 6/10)         Assessment & Plan  Acquired pronation deformity of foot, left

## 2023-06-03 NOTE — Assessment & Plan Note (Signed)
 Chronic, at goal (stable), changes made today: EMG study, prednisone    Orders:    EMG; Future

## 2023-06-08 ENCOUNTER — Encounter: Admit: 2023-06-08 | Admitting: Physician Assistant

## 2023-06-08 DIAGNOSIS — M5416 Radiculopathy, lumbar region: Secondary | ICD-10-CM

## 2023-06-09 MED ORDER — GABAPENTIN 300 MG PO CAPS
300 | ORAL_CAPSULE | ORAL | 0 refills | Status: DC
Start: 2023-06-09 — End: 2024-06-26

## 2023-06-09 NOTE — Telephone Encounter (Signed)
 Requested Prescriptions     Signed Prescriptions Disp Refills    gabapentin (NEURONTIN) 300 MG capsule 60 capsule 0     Sig: Take 1 tablet at bedtime for 3 days then may increase to twice daily     Authorizing Provider: Donne Hazel

## 2023-06-14 ENCOUNTER — Encounter
Payer: BLUE CROSS/BLUE SHIELD | Attending: Student in an Organized Health Care Education/Training Program | Primary: Family Medicine

## 2023-07-07 ENCOUNTER — Ambulatory Visit: Payer: BLUE CROSS/BLUE SHIELD | Attending: Specialist | Primary: Family Medicine

## 2023-07-28 ENCOUNTER — Ambulatory Visit: Payer: BLUE CROSS/BLUE SHIELD | Attending: Specialist | Primary: Family Medicine

## 2023-08-04 ENCOUNTER — Ambulatory Visit
Admit: 2023-08-04 | Discharge: 2023-08-04 | Payer: BLUE CROSS/BLUE SHIELD | Attending: Family Medicine | Primary: Family Medicine

## 2023-08-04 VITALS — BP 128/78 | HR 85 | Temp 97.70000°F | Wt 237.0 lb

## 2023-08-04 DIAGNOSIS — I10 Essential (primary) hypertension: Secondary | ICD-10-CM

## 2023-08-04 MED ORDER — ALBUTEROL SULFATE HFA 108 (90 BASE) MCG/ACT IN AERS
10890 | Freq: Four times a day (QID) | RESPIRATORY_TRACT | 3 refills | Status: AC | PRN
Start: 2023-08-04 — End: ?

## 2023-08-04 MED ORDER — ESCITALOPRAM OXALATE 20 MG PO TABS
20 | ORAL_TABLET | Freq: Every day | ORAL | 1 refills | Status: AC
Start: 2023-08-04 — End: 2024-01-31

## 2023-08-04 NOTE — Patient Instructions (Addendum)
If you receive an email to fill out a survey about our care here today, I would greatly appreciate it if you could fill it out for me, thank you!     I have sent an inhaler for you.  Please try this and Mucinex for the next 2 weeks.  If no improvement, please let me know and I can order a chest x-ray    Please let me know after you have completed your last shot of Zepbound if you would like to increase the dose

## 2023-08-04 NOTE — Progress Notes (Signed)
08/04/2023        Holly Long [birth year] is a 62 y.o. female who presents today with:  Chief Complaint   Patient presents with    Follow-up     Doing well on zepboud     Chest Congestion     Has chest congestion no other symptoms been ongoing for a while.         HPI:  Follow-up    HTN  controlled on current treatment. Reports no associated complaints. Adhering to a low salt diet; also trying to make healthy lifestyle changes.      HLD  Controlled on statin therapy. Ongoing healthy lifestyle changes.  Last lipid panel 02/12/2023 with LDL 98.     IBD  Crohn's Disease. Follows with GI on Stelara (Dr. Tiburcio Pea) at [facility]. Controlled on current treatment.      Muscle spasms b/l Les  Experiences intermittent muscle spasms in this setting which is well controlled on Tizanidine as needed.  Reports these have not happen for a while.     Anxiety/depression  Feels her anxiety is well controlled on Lexapro.  She is aware of potential weight gain and would like to continue the medication.  No associated complaints today.  Denies any paranoia, visual or auditory hallucinations, suicidal or homicidal ideations.     Morbid obesity  She has failed Adipex. She completed weight watchers for over 6 months between Jan 2024 - Aug 2024, and did not lose any weight.  She actually gained 4 pounds.  Exercising for 6 months has not helped lose any weight either. States she is doing well on Zepbound, has lost 43 pounds in the past 6 months.  She would like to continue.  She understands the risk of medullary thyroid carcinoma when continuing this medication over 6 months and would like to continue.    Chest congestion for the past few weeks she states that it began roughly 4 weeks ago with fever, aches, chills.  She states that she tried taking Mucinex a few weeks ago.  Denies any fever, aches, chills, nausea, vomiting, diarrhea, shortness of breath, but she admits to wheezing.    Health Maintenance  Colonoscopy: GI follows and does every 2  years  Pap smear: due 08/28/23  Mammogram: Diagnostic completed 02/21/2023 and normal.  Due for screening mammogram.  DEXA: due 10/18/25  PNA VAX: Had 2 years ago  Shingles VAX: done  Tetanus VAX: 04/06/29    Assessment & Plan   1. Benign essential HTN  2. Mixed hyperlipidemia  3. IBD (inflammatory bowel disease)  4. Muscle spasms of both lower extremities  5. GAD (generalized anxiety disorder)  6. MDD (major depressive disorder), recurrent, in partial remission (HCC)  7. Obesity (BMI 35.0-39.9 without comorbidity)  8. Anxiety and depression  -     escitalopram (LEXAPRO) 20 MG tablet; Take 1 tablet by mouth daily, Disp-90 tablet, R-1Normal  9. Chest congestion  -     albuterol sulfate HFA (VENTOLIN HFA) 108 (90 Base) MCG/ACT inhaler; Inhale 2 puffs into the lungs every 6 hours as needed for Wheezing, Disp-18 g, R-3Normal  10. Encounter for screening mammogram for malignant neoplasm of breast  -     MAM TOMO DIGITAL SCREEN BILATERAL; Future     Medications Discontinued During This Encounter   Medication Reason    escitalopram (LEXAPRO) 20 MG tablet REORDER     Return in about 6 months (around 02/01/2024) for RTN.    Screening mammogram ordered  Advised to use  Mucinex, albuterol inhaler, if no improvement in 2 weeks, CXR  Advised to let me know when she would like Zepbound refill and I will increase to 10 mg weekly  Refilled Lexapro, advised this can cause a significant weight gain compared to Zoloft, she will stay on this for now.    Objective  Allergies   Allergen Reactions    Latex      Current Outpatient Medications   Medication Sig Dispense Refill    vitamin B-12 (CYANOCOBALAMIN) 100 MCG tablet Take 0.5 tablets by mouth daily      escitalopram (LEXAPRO) 20 MG tablet Take 1 tablet by mouth daily 90 tablet 1    albuterol sulfate HFA (VENTOLIN HFA) 108 (90 Base) MCG/ACT inhaler Inhale 2 puffs into the lungs every 6 hours as needed for Wheezing 18 g 3    tirzepatide-weight management (ZEPBOUND) 7.5 MG/0.5ML SOAJ  subCUTAneous auto-injector pen Inject 7.5 mg once weekly subcutaneously. 2 mL 2    losartan (COZAAR) 100 MG tablet TAKE 1 TABLET BY MOUTH EVERY DAY 90 tablet 4    atorvastatin (LIPITOR) 20 MG tablet TAKE 1 TABLET BY MOUTH EVERY DAY 90 tablet 4    tiZANidine (ZANAFLEX) 4 MG tablet Take 1 tablet by mouth nightly as needed (Muscle spasms) 30 tablet 2    STELARA 90 MG/ML SOSY prefilled syringe       Cholecalciferol (VITAMIN D) 50 MCG (2000 UT) CAPS capsule Take by mouth      loratadine (CLARITIN) 10 MG capsule Take 1 capsule by mouth daily      gabapentin (NEURONTIN) 300 MG capsule Take 1 tablet at bedtime for 3 days then may increase to twice daily 60 capsule 0     No current facility-administered medications for this visit.       PMH, Surgical Hx, Family Hx, and Social Hx reviewed and updated.  Health Maintenance reviewed.    Vitals:    08/04/23 1052   BP: 128/78   Site: Left Upper Arm   Position: Sitting   Cuff Size: Medium Adult   Pulse: 85   Temp: 97.7 F (36.5 C)   TempSrc: Oral   SpO2: 98%   Weight: 107.5 kg (237 lb)     BP Readings from Last 3 Encounters:   08/04/23 128/78   05/31/23 115/80   04/13/23 118/76     Wt Readings from Last 3 Encounters:   08/04/23 107.5 kg (237 lb)   06/03/23 112.5 kg (248 lb)   05/31/23 121.1 kg (267 lb)       Lab Results   Component Value Date    LABA1C 5.5 02/12/2023    LABA1C 5.9 09/30/2022    LABA1C 5.7 08/27/2021     Lab Results   Component Value Date    CREATININE 0.73 02/12/2023     Lab Results   Component Value Date    ALT 15 02/12/2023    AST 16 02/12/2023     Lab Results   Component Value Date    CHOL 169 02/12/2023    TRIG 124 02/12/2023    HDL 46 02/12/2023        Review of Systems   Physical Exam  Constitutional:       General: She is not in acute distress.     Appearance: Normal appearance. She is obese. She is not toxic-appearing.   HENT:      Right Ear: External ear normal.      Left Ear: External ear normal.  Mouth/Throat:      Mouth: Mucous membranes are moist.    Eyes:      Extraocular Movements: Extraocular movements intact.      Pupils: Pupils are equal, round, and reactive to light.   Cardiovascular:      Rate and Rhythm: Normal rate and regular rhythm.      Pulses: Normal pulses.      Heart sounds: Normal heart sounds.   Pulmonary:      Effort: No respiratory distress.      Breath sounds: No wheezing.   Abdominal:      General: Bowel sounds are normal.      Palpations: Abdomen is soft.   Musculoskeletal:         General: Normal range of motion.   Skin:     General: Skin is warm and dry.   Neurological:      Mental Status: She is alert and oriented to person, place, and time. Mental status is at baseline.   Psychiatric:         Mood and Affect: Mood normal.         Behavior: Behavior normal.               Reviewed with the patient: current clinical status, medications, activities and diet.     Side effects, adverse effects of the medication prescribed today, as well as treatment plan/ rationale and result expectations have been discussed with the patient who expresses understanding and desires to proceed.    Close follow up to evaluate treatment results and for coordination of care.  I have reviewed the patient's medical history in detail and updated the computerized patient record.    Thermon Leyland, MD

## 2023-08-20 ENCOUNTER — Ambulatory Visit: Payer: BLUE CROSS/BLUE SHIELD | Primary: Family Medicine

## 2023-08-24 ENCOUNTER — Encounter

## 2023-08-24 MED ORDER — ZEPBOUND 7.5 MG/0.5ML SC SOAJ
7.50.5 MG/0.5ML | SUBCUTANEOUS | 2 refills | Status: DC
Start: 2023-08-24 — End: 2023-09-21

## 2023-08-25 ENCOUNTER — Inpatient Hospital Stay: Admit: 2023-08-25 | Payer: BLUE CROSS/BLUE SHIELD | Primary: Family Medicine

## 2023-08-25 DIAGNOSIS — Z1231 Encounter for screening mammogram for malignant neoplasm of breast: Secondary | ICD-10-CM

## 2023-08-30 ENCOUNTER — Encounter: Payer: BLUE CROSS/BLUE SHIELD | Attending: Family Medicine | Primary: Family Medicine

## 2023-09-21 ENCOUNTER — Encounter

## 2023-09-21 MED ORDER — TIRZEPATIDE-WEIGHT MANAGEMENT 10 MG/0.5ML SC SOAJ
10 | SUBCUTANEOUS | 2 refills | 28.00000 days | Status: DC
Start: 2023-09-21 — End: 2023-12-07

## 2023-11-08 ENCOUNTER — Encounter

## 2023-12-07 ENCOUNTER — Encounter

## 2023-12-07 MED ORDER — TIRZEPATIDE-WEIGHT MANAGEMENT 12.5 MG/0.5ML SC SOAJ
12.5 | SUBCUTANEOUS | 1 refills | 28.00000 days | Status: AC
Start: 2023-12-07 — End: ?

## 2024-02-06 ENCOUNTER — Encounter

## 2024-02-07 MED ORDER — ATORVASTATIN CALCIUM 20 MG PO TABS
20 | ORAL_TABLET | Freq: Every day | ORAL | 1 refills | 90.00000 days | Status: DC
Start: 2024-02-07 — End: 2024-06-26

## 2024-02-07 MED ORDER — LOSARTAN POTASSIUM 100 MG PO TABS
100 | ORAL_TABLET | Freq: Every day | ORAL | 1 refills | 90.00000 days | Status: DC
Start: 2024-02-07 — End: 2024-08-01

## 2024-02-14 ENCOUNTER — Ambulatory Visit
Admit: 2024-02-14 | Discharge: 2024-02-14 | Payer: BLUE CROSS/BLUE SHIELD | Attending: Family Medicine | Primary: Family Medicine

## 2024-02-14 DIAGNOSIS — I10 Essential (primary) hypertension: Principal | ICD-10-CM

## 2024-02-14 LAB — LIPID, FASTING
Cholesterol, Fasting: 142 mg/dL (ref 0–199)
HDL: 44 mg/dL (ref 40–59)
LDL Cholesterol: 81 mg/dL (ref 0–129)
Triglyceride, Fasting: 85 mg/dL (ref 0–150)

## 2024-02-14 LAB — HEMOGLOBIN A1C
Estimated Avg Glucose: 100 mg/dL
Hemoglobin A1C: 5.1 % (ref 4.0–6.0)

## 2024-02-14 MED ORDER — TIRZEPATIDE-WEIGHT MANAGEMENT 12.5 MG/0.5ML SC SOAJ
12.5 | SUBCUTANEOUS | 0 refills | 28.00000 days | Status: AC
Start: 2024-02-14 — End: 2024-05-14

## 2024-02-14 MED ORDER — ESCITALOPRAM OXALATE 10 MG PO TABS
10 | ORAL_TABLET | Freq: Every day | ORAL | 1 refills | 90.00000 days | Status: AC
Start: 2024-02-14 — End: 2024-08-12

## 2024-02-14 NOTE — Progress Notes (Signed)
 02/14/2024        Holly Long 09-Jan-1961 is a 63 y.o. female who presents today with:  Chief Complaint   Patient presents with    Anxiety     Wants to decrease lexapro  to 10 mg   Wants 3 more months on zepbound  and then come off of it        HPI: Follow-up     HTN  controlled on current treatment. Reports no associated complaints. Adhering to a low salt diet; also trying to make healthy lifestyle changes.      HLD  Controlled on statin therapy. Ongoing healthy lifestyle changes.  Last lipid panel 02/12/2023 with LDL 98.     IBD  Crohn's Disease. Follows with GI on Stelara (Dr. Arloa) at [facility]. Controlled on current treatment.      Muscle spasms b/l Les  Controlled, rarely needs to use tizanidine .     Anxiety/depression  Feels her anxiety is well controlled on Lexapro .  She decreased from 20 mg down to 10 mg roughly 4 months ago.  She is aware of potential weight gain and would like to continue the medication.  No associated complaints today.  Denies any paranoia, visual or auditory hallucinations, suicidal or homicidal ideations.     Morbid obesity  She had failed Adipex. She completed weight watchers for over 6 months between Jan 2024 - Aug 2024, and did not lose any weight.  She actually gained 4 pounds.  Exercising for 6 months did not help lose any weight either. States she is doing well on Zepbound . She would like to continue.  She understands the risk of medullary thyroid carcinoma when continuing this medication over 6 months and would like to continue.       Health Maintenance  Colonoscopy: GI follows and does every 2 years, due 10/2024  Pap smear: due 08/28/23  Mammogram: 08/25/2023 normal  DEXA: due 10/18/25  PNA VAX: Had 2 years ago  Shingles VAX: done  Tetanus VAX: 04/06/29    Assessment & Plan   1. Benign essential HTN  2. Class 3 severe obesity due to excess calories without serious comorbidity with body mass index (BMI) of 40.0 to 44.9 in adult (HCC)  -     tirzepatide -weight management (ZEPBOUND ) 12.5  MG/0.5ML SOAJ subCUTAneous auto-injector pen; Inject 12.5 mg into the skin once a week, Disp-6 mL, R-0Normal  3. Zinc deficiency  -     Zinc; Future  4. Allergic conjunctivitis of both eyes  5. Lumbar radiculopathy  6. IBD (inflammatory bowel disease)  7. Mixed hyperlipidemia  -     Lipid, Fasting; Future  8. Other intervertebral disc displacement, lumbar region  9. Herniated lumbar intervertebral disc  10. MDD (major depressive disorder), recurrent, in partial remission  -     escitalopram  (LEXAPRO ) 10 MG tablet; Take 1 tablet by mouth daily, Disp-90 tablet, R-1Normal  11. GAD (generalized anxiety disorder)  -     escitalopram  (LEXAPRO ) 10 MG tablet; Take 1 tablet by mouth daily, Disp-90 tablet, R-1Normal  12. Muscle spasms of both lower extremities  13. Screening for diabetes mellitus  -     Hemoglobin A1C; Future     Medications Discontinued During This Encounter   Medication Reason    escitalopram  (LEXAPRO ) 20 MG tablet LIST CLEANUP    tiZANidine  (ZANAFLEX ) 4 MG tablet LIST CLEANUP    tirzepatide -weight management (ZEPBOUND ) 12.5 MG/0.5ML SOAJ subCUTAneous auto-injector pen REORDER     Return in about 6 months (around 08/16/2024) for  RTN in person or virtual.    She understands the risk of continuing Zepbound  for over 6 months.  She would like to continue for another 3 months then stop.  Decreased Lexapro  to 10 mg.    Objective  Allergies   Allergen Reactions    Latex      Current Outpatient Medications   Medication Sig Dispense Refill    tirzepatide -weight management (ZEPBOUND ) 12.5 MG/0.5ML SOAJ subCUTAneous auto-injector pen Inject 12.5 mg into the skin once a week 6 mL 0    escitalopram  (LEXAPRO ) 10 MG tablet Take 1 tablet by mouth daily 90 tablet 1    losartan  (COZAAR ) 100 MG tablet TAKE 1 TABLET BY MOUTH EVERY DAY 90 tablet 1    atorvastatin  (LIPITOR) 20 MG tablet TAKE 1 TABLET BY MOUTH EVERY DAY 90 tablet 1    vitamin B-12 (CYANOCOBALAMIN) 100 MCG tablet Take 0.5 tablets by mouth daily      albuterol   sulfate HFA (VENTOLIN  HFA) 108 (90 Base) MCG/ACT inhaler Inhale 2 puffs into the lungs every 6 hours as needed for Wheezing 18 g 3    gabapentin  (NEURONTIN ) 300 MG capsule Take 1 tablet at bedtime for 3 days then may increase to twice daily 60 capsule 0    STELARA 90 MG/ML SOSY prefilled syringe       Cholecalciferol (VITAMIN D) 50 MCG (2000 UT) CAPS capsule Take by mouth      loratadine (CLARITIN) 10 MG capsule Take 1 capsule by mouth daily       No current facility-administered medications for this visit.       PMH, Surgical Hx, Family Hx, and Social Hx reviewed and updated.  Health Maintenance reviewed.    Vitals:    02/14/24 1021   BP: 110/68   Pulse: 82   SpO2: 98%   Weight: 93 kg (205 lb)   Height: 1.676 m (5' 6)     BP Readings from Last 3 Encounters:   02/14/24 110/68   08/04/23 128/78   05/31/23 115/80     Wt Readings from Last 3 Encounters:   02/14/24 93 kg (205 lb)   08/04/23 107.5 kg (237 lb)   06/03/23 112.5 kg (248 lb)       Lab Results   Component Value Date    LABA1C 5.5 02/12/2023    LABA1C 5.9 09/30/2022    LABA1C 5.7 08/27/2021     Lab Results   Component Value Date    CREATININE 0.73 02/12/2023     Lab Results   Component Value Date    ALT 15 02/12/2023    AST 16 02/12/2023     Lab Results   Component Value Date    CHOL 169 02/12/2023    TRIG 124 02/12/2023    HDL 46 02/12/2023        Review of Systems   Physical Exam  Constitutional:       General: She is not in acute distress.     Appearance: Normal appearance. She is not toxic-appearing.   HENT:      Right Ear: External ear normal.      Left Ear: External ear normal.      Mouth/Throat:      Mouth: Mucous membranes are moist.   Eyes:      Extraocular Movements: Extraocular movements intact.      Pupils: Pupils are equal, round, and reactive to light.   Cardiovascular:      Rate and Rhythm: Normal rate and regular  rhythm.      Pulses: Normal pulses.      Heart sounds: Normal heart sounds.   Pulmonary:      Effort: No respiratory distress.       Breath sounds: No wheezing.   Abdominal:      General: Bowel sounds are normal.      Palpations: Abdomen is soft.   Musculoskeletal:         General: Normal range of motion.   Skin:     General: Skin is warm and dry.   Neurological:      Mental Status: She is alert and oriented to person, place, and time. Mental status is at baseline.   Psychiatric:         Mood and Affect: Mood normal.         Behavior: Behavior normal.               Reviewed with the patient: current clinical status, medications, activities and diet.     Side effects, adverse effects of the medication prescribed today, as well as treatment plan/ rationale and result expectations have been discussed with the patient who expresses understanding and desires to proceed.    Close follow up to evaluate treatment results and for coordination of care.  I have reviewed the patient's medical history in detail and updated the computerized patient record.    [place] GORMAN Regan, MD

## 2024-02-14 NOTE — Patient Instructions (Signed)
 If you receive an email to fill out a survey about our care here today, I would greatly appreciate it if you could fill it out for me, thank you!

## 2024-05-22 ENCOUNTER — Encounter: Payer: BLUE CROSS/BLUE SHIELD | Attending: Family Medicine | Primary: Family Medicine

## 2024-06-26 ENCOUNTER — Ambulatory Visit
Admit: 2024-06-26 | Discharge: 2024-06-26 | Payer: BLUE CROSS/BLUE SHIELD | Attending: Family Medicine | Primary: Family Medicine

## 2024-06-26 MED ORDER — TIRZEPATIDE-WEIGHT MANAGEMENT 12.5 MG/0.5ML SC SOAJ
12.5 | SUBCUTANEOUS | 0 refills | 28.00000 days | Status: DC
Start: 2024-06-26 — End: 2024-07-21

## 2024-06-26 NOTE — Patient Instructions (Addendum)
 If you receive an email to fill out a survey about our care here today, I would greatly appreciate it if you could fill it out for me, thank you!     Natural remedies to lower cholesterol include the following:   - 1 to 2 [place] nuts per day.  Studies have shown this can decrease LDL cholesterol.  These have a high selenium content, I would not recommend more than 2 per day.  - Fish oil.  I recommend at least 1000 mg combined of EPA and DHA.  There are many fish oil supplements with no fishy aftertaste.

## 2024-06-26 NOTE — Progress Notes (Signed)
 "  06/26/2024        Holly Long [birth year] is a 63 y.o. female who presents today with:  Chief Complaint   Patient presents with    Ear Fullness     Started last Sunday, right ear feels full, wants to go back on zepbound        HPI:   Morbid obesity  She had failed Adipex. She completed weight watchers for over 6 months between Jan 2024 - Aug 2024, and did not lose any weight.  She actually gained 4 pounds.  Exercising for 6 months did not help lose any weight either. States she did well on Zepbound .  She was last prescribed this 02/14/2024 for 3 months.  It was recommended to stop this medication for 6 months, she has currently been off of the medication for about 1.5 months.  She would like to restart.    HLD  Lipid panel 02/14/2024: LDL 81, previously 98, HDL 44, triglycerides 85  The 10-year ASCVD risk score (Arnett DK, et al., 2019) is: 6.1%    Values used to calculate the score:      Age: 88 years      Clinically relevant sex: Female      Is Non-Hispanic African American: No      Diabetic: No      Tobacco smoker: No      Systolic Blood Pressure: 128 mmHg      Is BP treated: Yes      HDL Cholesterol: 44 mg/dL      Total Cholesterol: 142 mg/dL     Right ear fullness for 2 days.  Denies any nasal congestion, coughing, fever, aches, chills, nausea, vomiting, diarrhea.  She was treated with amoxicillin and reports ear fullness is persisting.  She has been applying Debrox for the past 1 week to the right ear.    Assessment & Plan   1. Obesity (BMI 30-39.9)  -     tirzepatide -weight management (ZEPBOUND ) 12.5 MG/0.5ML SOAJ subCUTAneous auto-injector pen; Inject 12.5 mg into the skin every 7 days, Disp-6 mL, R-0Normal  2. Benign essential HTN  3. Lumbar radiculopathy  4. Impacted cerumen, right ear  -     REMOVE IMPACTED EAR WAX  5. Mixed hyperlipidemia     Medications Discontinued During This Encounter   Medication Reason    atorvastatin  (LIPITOR) 20 MG tablet Therapy completed    gabapentin  (NEURONTIN ) 300 MG capsule  Therapy completed     Return in about 6 months (around 12/25/2024).    She would like to stop atorvastatin .  Chart review shows lipid panels have been normal however I do not see previous lipid panels from when she was started on the medication and she is not aware of what her cholesterol was.  I advised her that as her risk score is low, medication is not recommended.  She has also lost a significant amount of weight.  Recommended to stay off the medication for at least 3 months before retesting cholesterol.    Patient understands the risk of medullary thyroid carcinoma when continuing this medication over 6 months and she has not been off the medication for at least 3 months.  She still would like to restart today.    Patient was asked about her current diet and exercise habits, and personalized advice was provided regarding recommended lifestyle changes.  Patient's comorbid health conditions associated with elevated BMI were discussed, including hypertension, as well as the likely benefits of weight loss.  Based upon  patient's motivation to change her behavior, the following plan was agreed upon to work toward a weight loss goal of 60 pounds: medication prescribed: Zepbound . Educational materials for  weight loss were provided.  Patient will follow-up in 3 month(s) with PCP.  Provider spent 12 minutes counseling patient.    MA performed cerumen disimpaction with irrigation, I reviewed and inspected afterwards, ear canal not clear, I then cleaned the right ear canal myself with instrumentation, patient stable.  Advised to apply Debrox to the left ear canal as it is also blocked and to follow-up if she would like it cleaned.  Objective  Allergies   Allergen Reactions    Latex      Current Outpatient Medications   Medication Sig Dispense Refill    amoxicillin (AMOXIL) 500 MG tablet TAKE 2 TABLETS BY MOUTH 3 TIMES DAILY X 5 DAYS. TAKE WITH FOOD AND WATER.      methylPREDNISolone  (MEDROL  DOSEPACK) 4 MG tablet TAKE 6  TABLETS ON DAY 1 AS DIRECTED ON PACKAGE AND DECREASE BY 1 TAB EACH DAY FOR A TOTAL OF 6 DAYS      tirzepatide -weight management (ZEPBOUND ) 12.5 MG/0.5ML SOAJ subCUTAneous auto-injector pen Inject 12.5 mg into the skin every 7 days 6 mL 0    escitalopram  (LEXAPRO ) 10 MG tablet Take 1 tablet by mouth daily 90 tablet 1    losartan  (COZAAR ) 100 MG tablet TAKE 1 TABLET BY MOUTH EVERY DAY 90 tablet 1    vitamin B-12 (CYANOCOBALAMIN) 100 MCG tablet Take 0.5 tablets by mouth daily      albuterol  sulfate HFA (VENTOLIN  HFA) 108 (90 Base) MCG/ACT inhaler Inhale 2 puffs into the lungs every 6 hours as needed for Wheezing 18 g 3    STELARA 90 MG/ML SOSY prefilled syringe       Cholecalciferol (VITAMIN D) 50 MCG (2000 UT) CAPS capsule Take by mouth      loratadine (CLARITIN) 10 MG capsule Take 1 capsule by mouth daily       No current facility-administered medications for this visit.       PMH, Surgical Hx, Family Hx, and Social Hx reviewed and updated.  Health Maintenance reviewed.    Vitals:    06/26/24 1101   BP: 128/74   BP Site: Right Upper Arm   Patient Position: Sitting   BP Cuff Size: Large Adult   Pulse: 63   SpO2: 90%   Weight: 91.6 kg (202 lb)   Height: 1.676 m (5' 6)     BP Readings from Last 3 Encounters:   06/26/24 128/74   02/14/24 110/68   08/04/23 128/78     Wt Readings from Last 3 Encounters:   06/26/24 91.6 kg (202 lb)   02/14/24 93 kg (205 lb)   08/04/23 107.5 kg (237 lb)       Lab Results   Component Value Date    LABA1C 5.1 02/14/2024    LABA1C 5.5 02/12/2023    LABA1C 5.9 09/30/2022     Lab Results   Component Value Date    CREATININE 0.73 02/12/2023     Lab Results   Component Value Date    ALT 15 02/12/2023    AST 16 02/12/2023     Lab Results   Component Value Date    CHOL 169 02/12/2023    TRIG 124 02/12/2023    HDL 44 02/14/2024        Review of Systems   Physical Exam  Constitutional:  General: She is not in acute distress.     Appearance: Normal appearance. She is obese. She is not  toxic-appearing.   HENT:      Right Ear: External ear normal. There is impacted cerumen.      Left Ear: External ear normal. There is impacted cerumen.      Mouth/Throat:      Mouth: Mucous membranes are moist.   Eyes:      Extraocular Movements: Extraocular movements intact.      Pupils: Pupils are equal, round, and reactive to light.   Cardiovascular:      Rate and Rhythm: Normal rate and regular rhythm.      Pulses: Normal pulses.      Heart sounds: Normal heart sounds.   Pulmonary:      Effort: No respiratory distress.      Breath sounds: No wheezing.   Abdominal:      General: Bowel sounds are normal.      Palpations: Abdomen is soft.   Musculoskeletal:         General: Normal range of motion.   Skin:     General: Skin is warm and dry.   Neurological:      Mental Status: She is alert and oriented to person, place, and time. Mental status is at baseline.   Psychiatric:         Mood and Affect: Mood normal.         Behavior: Behavior normal.               Reviewed with the patient: current clinical status, medications, activities and diet.     Side effects, adverse effects of the medication prescribed today, as well as treatment plan/ rationale and result expectations have been discussed with the patient who expresses understanding and desires to proceed.    Close follow up to evaluate treatment results and for coordination of care.  I have reviewed the patient's medical history in detail and updated the computerized patient record.    [place] GORMAN Regan, MD    "

## 2024-06-27 MED ORDER — FLUTICASONE PROPIONATE 50 MCG/ACT NA SUSP
50 | Freq: Every day | NASAL | 0 refills | 42.00000 days | Status: AC
Start: 2024-06-27 — End: ?

## 2024-06-27 NOTE — Addendum Note (Signed)
"  Addended by: Travell Desaulniers on: 06/27/2024 12:20 PM     Modules accepted: Orders    "

## 2024-07-21 ENCOUNTER — Encounter

## 2024-07-21 MED ORDER — ZEPBOUND 12.5 MG/0.5ML SC SOAJ
12.5 | SUBCUTANEOUS | 2 refills | 28.00000 days | Status: AC
Start: 2024-07-21 — End: ?

## 2024-08-01 ENCOUNTER — Encounter

## 2024-08-01 MED ORDER — LOSARTAN POTASSIUM 100 MG PO TABS
100 | ORAL_TABLET | Freq: Every day | ORAL | 0 refills | 90.00000 days | Status: AC
Start: 2024-08-01 — End: ?
